# Patient Record
Sex: Male | Born: 1960 | Race: White | Hispanic: No | Marital: Married | Smoking: Former smoker
Health system: Southern US, Community
[De-identification: ages and names within clinical notes are randomized; demographics above are authoritative.]

## PROBLEM LIST (undated history)

## (undated) DIAGNOSIS — R29898 Other symptoms and signs involving the musculoskeletal system: Secondary | ICD-10-CM

## (undated) DIAGNOSIS — IMO0002 Reserved for concepts with insufficient information to code with codable children: Secondary | ICD-10-CM

## (undated) DIAGNOSIS — I219 Acute myocardial infarction, unspecified: Secondary | ICD-10-CM

## (undated) DIAGNOSIS — K219 Gastro-esophageal reflux disease without esophagitis: Secondary | ICD-10-CM

## (undated) DIAGNOSIS — I10 Essential (primary) hypertension: Secondary | ICD-10-CM

## (undated) DIAGNOSIS — M1990 Unspecified osteoarthritis, unspecified site: Secondary | ICD-10-CM

## (undated) DIAGNOSIS — G43909 Migraine, unspecified, not intractable, without status migrainosus: Secondary | ICD-10-CM

---

## 2000-07-07 ENCOUNTER — Emergency Department (HOSPITAL_COMMUNITY): Admission: EM | Admit: 2000-07-07 | Discharge: 2000-07-07 | Payer: Self-pay | Admitting: Emergency Medicine

## 2000-07-08 ENCOUNTER — Emergency Department (HOSPITAL_COMMUNITY): Admission: EM | Admit: 2000-07-08 | Discharge: 2000-07-08 | Payer: Self-pay | Admitting: Emergency Medicine

## 2000-07-09 ENCOUNTER — Emergency Department (HOSPITAL_COMMUNITY): Admission: EM | Admit: 2000-07-09 | Discharge: 2000-07-09 | Payer: Self-pay | Admitting: Emergency Medicine

## 2001-02-26 ENCOUNTER — Emergency Department (HOSPITAL_COMMUNITY): Admission: EM | Admit: 2001-02-26 | Discharge: 2001-02-26 | Payer: Self-pay | Admitting: Emergency Medicine

## 2001-02-26 ENCOUNTER — Encounter: Payer: Self-pay | Admitting: Emergency Medicine

## 2001-07-30 ENCOUNTER — Encounter: Payer: Self-pay | Admitting: Emergency Medicine

## 2001-07-30 ENCOUNTER — Emergency Department (HOSPITAL_COMMUNITY): Admission: EM | Admit: 2001-07-30 | Discharge: 2001-07-30 | Payer: Self-pay | Admitting: Emergency Medicine

## 2004-05-11 ENCOUNTER — Emergency Department (HOSPITAL_COMMUNITY): Admission: EM | Admit: 2004-05-11 | Discharge: 2004-05-11 | Payer: Self-pay | Admitting: Emergency Medicine

## 2004-06-08 ENCOUNTER — Emergency Department (HOSPITAL_COMMUNITY): Admission: EM | Admit: 2004-06-08 | Discharge: 2004-06-09 | Payer: Self-pay | Admitting: Emergency Medicine

## 2006-03-18 ENCOUNTER — Emergency Department (HOSPITAL_COMMUNITY): Admission: EM | Admit: 2006-03-18 | Discharge: 2006-03-18 | Payer: Self-pay | Admitting: Emergency Medicine

## 2011-01-28 ENCOUNTER — Emergency Department (HOSPITAL_COMMUNITY)
Admission: EM | Admit: 2011-01-28 | Discharge: 2011-01-28 | Disposition: A | Discharge status: Home / Self Care | Payer: Self-pay | Attending: Emergency Medicine | Admitting: Emergency Medicine

## 2011-01-28 DIAGNOSIS — S51809A Unspecified open wound of unspecified forearm, initial encounter: Secondary | ICD-10-CM | POA: Insufficient documentation

## 2011-01-28 DIAGNOSIS — W269XXA Contact with unspecified sharp object(s), initial encounter: Secondary | ICD-10-CM | POA: Insufficient documentation

## 2011-04-25 ENCOUNTER — Emergency Department (HOSPITAL_COMMUNITY): Payer: Self-pay

## 2011-04-25 ENCOUNTER — Emergency Department (HOSPITAL_COMMUNITY)
Admission: EM | Admit: 2011-04-25 | Discharge: 2011-04-25 | Disposition: A | Discharge status: Home / Self Care | Payer: Self-pay | Attending: Emergency Medicine | Admitting: Emergency Medicine

## 2011-04-25 DIAGNOSIS — R55 Syncope and collapse: Secondary | ICD-10-CM | POA: Insufficient documentation

## 2011-04-25 DIAGNOSIS — W1809XA Striking against other object with subsequent fall, initial encounter: Secondary | ICD-10-CM | POA: Insufficient documentation

## 2011-04-25 DIAGNOSIS — M542 Cervicalgia: Secondary | ICD-10-CM | POA: Insufficient documentation

## 2011-04-25 DIAGNOSIS — R42 Dizziness and giddiness: Secondary | ICD-10-CM | POA: Insufficient documentation

## 2011-04-25 DIAGNOSIS — S0180XA Unspecified open wound of other part of head, initial encounter: Secondary | ICD-10-CM | POA: Insufficient documentation

## 2011-04-25 LAB — DIFFERENTIAL
Basophils Relative: 0 % (ref 0–1)
Eosinophils Absolute: 0.2 10*3/uL (ref 0.0–0.7)
Neutrophils Relative %: 56 % (ref 43–77)

## 2011-04-25 LAB — BASIC METABOLIC PANEL
Chloride: 103 mEq/L (ref 96–112)
Creatinine, Ser: 0.93 mg/dL (ref 0.4–1.5)
GFR calc Af Amer: >60 mL/min (ref >60)
GFR calc non Af Amer: >60 mL/min (ref >60)
Potassium: 3.5 mEq/L (ref 3.5–5.1)

## 2011-04-25 LAB — CBC
MCH: 32 pg (ref 26.0–34.0)
Platelets: 369 10*3/uL (ref 150–400)
RBC: 4.28 MIL/uL (ref 4.22–5.81)
RDW: 13.1 % (ref 11.5–15.5)
WBC: 9.2 10*3/uL (ref 4.0–10.5)

## 2011-04-25 LAB — GLUCOSE, CAPILLARY: Glucose-Capillary: 113 mg/dL — ABNORMAL HIGH (ref 70–99)

## 2012-08-30 ENCOUNTER — Encounter (HOSPITAL_COMMUNITY): Payer: Self-pay | Admitting: Emergency Medicine

## 2012-08-30 ENCOUNTER — Emergency Department (HOSPITAL_COMMUNITY)
Admission: EM | Admit: 2012-08-30 | Discharge: 2012-08-30 | Disposition: A | Discharge status: Home / Self Care | Payer: Self-pay | Attending: Emergency Medicine | Admitting: Emergency Medicine

## 2012-08-30 DIAGNOSIS — M545 Low back pain, unspecified: Secondary | ICD-10-CM

## 2012-08-30 DIAGNOSIS — K219 Gastro-esophageal reflux disease without esophagitis: Secondary | ICD-10-CM | POA: Insufficient documentation

## 2012-08-30 DIAGNOSIS — Z885 Allergy status to narcotic agent status: Secondary | ICD-10-CM | POA: Insufficient documentation

## 2012-08-30 HISTORY — DX: Gastro-esophageal reflux disease without esophagitis: K21.9

## 2012-08-30 HISTORY — DX: Reserved for concepts with insufficient information to code with codable children: IMO0002

## 2012-08-30 MED ORDER — HYDROCODONE-ACETAMINOPHEN 5-325 MG PO TABS
1.0000 | ORAL_TABLET | Freq: Once | ORAL | Status: AC
  Administered 2012-08-30: 1 via ORAL
  Filled 2012-08-30: qty 1

## 2012-08-30 MED ORDER — DIAZEPAM 5 MG PO TABS
10.0000 mg | ORAL_TABLET | Freq: Once | ORAL | Status: AC
  Administered 2012-08-30: 10 mg via ORAL

## 2012-08-30 MED ORDER — DIAZEPAM 5 MG PO TABS: 5.0000 mg | ORAL_TABLET | Freq: Three times a day (TID) | ORAL | Status: DC | PRN

## 2012-08-30 MED ORDER — HYDROCODONE-ACETAMINOPHEN 5-325 MG PO TABS: ORAL_TABLET | ORAL | Status: DC

## 2012-08-30 NOTE — ED Provider Notes (Signed)
History     CSN: [id number]  Arrival date & time 08/30/12  7829   First MD Initiated Contact with Patient 08/30/12 0825      Chief Complaint  Patient presents with  . 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 He reports that he's had an occasional low back pain that he will taken ibuprofen for her which will improve his symptoms. He has never had any urinary symptoms, fever, numbness or weakness. He reports yesterday while he was under his own house he came across a snake which made him jump and twisted his back. He initially did not have much pain but did take a few ibuprofen after that episode. This morning at around 3 AM he got up to use the bathroom and when he tried to stand up from the bed he had a sharp pain that shot across his low back and now has pain in the lower left back and he indicates it is just lateral to midline. He didn't denies any shooting numbness or pain down his legs. He was able to urinate without any difficulty. He denies any skin rash. He denies any abdominal pain or nausea.  Patient is a 51 y.o. male presenting with back pain. The history is provided by the patient and the spouse.  Back Pain  Pertinent negatives include no fever, no numbness, no abdominal pain and no weakness.    Past Medical History  Diagnosis Date  . GERD (gastroesophageal reflux disease)     No past surgical history on file.  No family history on file.  History  Substance Use Topics  . Smoking status: Not on file  . Smokeless tobacco: Not on file  . Alcohol Use:       Review of Systems  Constitutional: Negative for fever, chills and unexpected weight change.  Gastrointestinal: Negative for nausea and abdominal pain.  Musculoskeletal: Positive for back pain.  Skin: Negative for rash and wound.  Neurological:  Negative for weakness and numbness.    Allergies  Morphine and related  Home Medications   Current Outpatient Rx  Name Route Sig Dispense Refill  . IBUPROFEN 200 MG PO TABS Oral Take 400 mg by mouth every 6 (six) hours as needed. For pain    . LANSOPRAZOLE 30 MG PO CPDR Oral Take 30 mg by mouth daily.    . CENTRUM SILVER ADULT 50+ PO Oral Take 1 tablet by mouth daily.    Marland Kitchen DIAZEPAM 5 MG PO TABS Oral Take 1 tablet (5 mg total) by mouth every 8 (eight) hours as needed (muscle spasms). 15 tablet 0  . HYDROCODONE-ACETAMINOPHEN 5-325 MG PO TABS  1-2 tablets po q 6 hours prn moderate to severe pain 20 tablet 0    BP 135/86  Pulse 66  Temp 97.8 F (36.6 C) (Oral)  Resp 16  SpO2 100%  Physical Exam  Nursing note and vitals reviewed. Constitutional: He appears well-developed and well-nourished. No distress.  Neck: Normal range of motion. Neck supple.  Abdominal: Soft. He exhibits no distension. There is no tenderness. There is no rebound.  Musculoskeletal: He exhibits tenderness. He exhibits no edema.       Lumbar back: He exhibits tenderness, pain and spasm. He exhibits no bony tenderness, no laceration and normal pulse.       Back:  Neurological: He is alert. He displays normal reflexes. Coordination normal.  Skin: Skin is warm and dry. No rash noted. He is not diaphoretic.  Psychiatric: He has a normal mood and affect.    ED Course  Procedures (including critical care time)  Labs Reviewed - No data to display No results found.   1. Low back pain       MDM  Patient with no numbness or weakness, reproducible low back spasms. Straight leg test is negative. Normal patellar reflexes bilaterally. History and exam is highly suggestive of musculoskeletal pain. I did inform him and his spouse to watch for extending weakness, urinary difficulty or incontinence. Plan is to continue treatment with NSAIDs at home and will also give a prescription for narcotic analgesia and  benzodiazepine. Otherwise he can continue evaluation and treatment as an outpatient as needed.        Gavin Pound. Oletta Lamas, MD 08/30/12 [phone]

## 2012-08-30 NOTE — ED Notes (Addendum)
Pt c/o back pain x 1 week wiith no history of back pain. No associated symptoms. Ambulatory, [facility]

## 2012-08-30 NOTE — Discharge Instructions (Signed)
 Back Pain, Adult Low back pain is very common. About 1 in 5 people have back pain.The cause of low back pain is rarely dangerous. The pain often gets better over time.About half of people with a sudden onset of back pain feel better in just 2 weeks. About 8 in 10 people feel better by 6 weeks.  CAUSES Some common causes of back pain include:  Strain of the muscles or ligaments supporting the spine.  Wear and tear (degeneration) of the spinal discs.  Arthritis.  Direct injury to the back. DIAGNOSIS Most of the time, the direct cause of low back pain is not known.However, back pain can be treated effectively even when the exact cause of the pain is unknown.Answering your caregiver's questions about your overall health and symptoms is one of the most accurate ways to make sure the cause of your pain is not dangerous. If your caregiver needs more information, he or she may order lab work or imaging tests (X-rays or MRIs).However, even if imaging tests show changes in your back, this usually does not require surgery. HOME CARE INSTRUCTIONS For many people, back pain returns.Since low back pain is rarely dangerous, it is often a condition that people can learn to [facility] their own.   Remain active. It is stressful on the back to sit or stand in one place. Do not sit, drive, or stand in one place for more than 30 minutes at a time. Take short walks on level surfaces as soon as pain allows.Try to increase the length of time you walk each day.  Do not stay in bed.Resting more than 1 or 2 days can delay your recovery.  Do not avoid exercise or work.Your body is made to move.It is not dangerous to be active, even though your back may hurt.Your back will likely heal faster if you return to being active before your pain is gone.  Pay attention to your body when you bend and lift. Many people have less discomfortwhen lifting if they bend their knees, keep the load close to their bodies,and  avoid twisting. Often, the most comfortable positions are those that put less stress on your recovering back.  Find a comfortable position to sleep. Use a firm mattress and lie on your side with your knees slightly bent. If you lie on your back, put a pillow under your knees.  Only take over-the-counter or prescription medicines as directed by your caregiver. Over-the-counter medicines to reduce pain and inflammation are often the most helpful.Your caregiver may prescribe muscle relaxant drugs.These medicines help dull your pain so you can more quickly return to your normal activities and healthy exercise.  Put ice on the injured area.  Put ice in a plastic bag.  Place a towel between your skin and the bag.  Leave the ice on for 15 to 20 minutes, 3 to 4 times a day for the first 2 to 3 days. After that, ice and heat may be alternated to reduce pain and spasms.  Ask your caregiver about trying back exercises and gentle massage. This may be of some benefit.  Avoid feeling anxious or stressed.Stress increases muscle tension and can worsen back pain.It is important to recognize when you are anxious or stressed and learn ways to manage it.Exercise is a great option. SEEK MEDICAL CARE IF:  You have pain that is not relieved with rest or medicine.  You have pain that does not improve in 1 week.  You have new symptoms.  You are generally  not feeling well. SEEK IMMEDIATE MEDICAL CARE IF:   You have pain that radiates from your back into your legs.  You develop new bowel or bladder control problems.  You have unusual weakness or numbness in your arms or legs.  You develop nausea or vomiting.  You develop abdominal pain.  You feel faint. Document Released: 11/14/2005 Document Revised: 05/15/2012 Document Reviewed: 04/04/2011 [facility] Patient Information 2013 [place], [place].    Narcotic and benzodiazepine use may cause drowsiness, slowed breathing or dependence.  Please use with  caution and do not drive, operate machinery or watch young children alone while taking them.  Taking combinations of these medications or drinking alcohol will potentiate these effects.

## 2014-11-22 ENCOUNTER — Encounter (HOSPITAL_COMMUNITY): Payer: Self-pay | Admitting: Emergency Medicine

## 2014-11-22 ENCOUNTER — Emergency Department (HOSPITAL_COMMUNITY)
Admission: EM | Admit: 2014-11-22 | Discharge: 2014-11-22 | Disposition: A | Discharge status: Home / Self Care | Payer: Self-pay | Attending: Emergency Medicine | Admitting: Emergency Medicine

## 2014-11-22 ENCOUNTER — Emergency Department (HOSPITAL_COMMUNITY): Payer: Self-pay

## 2014-11-22 DIAGNOSIS — Z79899 Other long term (current) drug therapy: Secondary | ICD-10-CM | POA: Insufficient documentation

## 2014-11-22 DIAGNOSIS — S20212A Contusion of left front wall of thorax, initial encounter: Secondary | ICD-10-CM | POA: Insufficient documentation

## 2014-11-22 DIAGNOSIS — Y998 Other external cause status: Secondary | ICD-10-CM | POA: Insufficient documentation

## 2014-11-22 DIAGNOSIS — K219 Gastro-esophageal reflux disease without esophagitis: Secondary | ICD-10-CM | POA: Insufficient documentation

## 2014-11-22 DIAGNOSIS — Z87891 Personal history of nicotine dependence: Secondary | ICD-10-CM | POA: Insufficient documentation

## 2014-11-22 DIAGNOSIS — T1490XA Injury, unspecified, initial encounter: Secondary | ICD-10-CM

## 2014-11-22 DIAGNOSIS — S4992XA Unspecified injury of left shoulder and upper arm, initial encounter: Secondary | ICD-10-CM | POA: Insufficient documentation

## 2014-11-22 DIAGNOSIS — Y9389 Activity, other specified: Secondary | ICD-10-CM | POA: Insufficient documentation

## 2014-11-22 DIAGNOSIS — Y9289 Other specified places as the place of occurrence of the external cause: Secondary | ICD-10-CM | POA: Insufficient documentation

## 2014-11-22 DIAGNOSIS — W208XXA Other cause of strike by thrown, projected or falling object, initial encounter: Secondary | ICD-10-CM | POA: Insufficient documentation

## 2014-11-22 MED ORDER — OXYCODONE-ACETAMINOPHEN 5-325 MG PO TABS
2.0000 | ORAL_TABLET | Freq: Once | ORAL | Status: AC
  Administered 2014-11-22: 2 via ORAL
  Filled 2014-11-22: qty 2

## 2014-11-22 MED ORDER — OXYCODONE-ACETAMINOPHEN 5-325 MG PO TABS: 1.0000 | ORAL_TABLET | ORAL | Status: DC | PRN

## 2014-11-22 MED ORDER — IBUPROFEN 800 MG PO TABS: 800.0000 mg | ORAL_TABLET | Freq: Three times a day (TID) | ORAL | Status: DC
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
## 2014-11-22 NOTE — Discharge Instructions (Signed)
Chest Contusion °A contusion is a deep bruise. Bruises happen when an injury causes bleeding under the skin. Signs of bruising include pain, puffiness (swelling), and discolored skin. The bruise may turn blue, purple, or yellow.  °HOME CARE °· Put ice on the injured area. °¨ Put ice in a plastic bag. °¨ Place a towel between the skin and the bag. °¨ Leave the ice on for 15-20 minutes at a time, 03-04 times a day for the first 48 hours. °· Only take medicine as told by your doctor. °· Rest. °· Take deep breaths (deep-breathing exercises) as told by your doctor. °· Stop smoking if you smoke. °· Do not lift objects over 5 pounds (2.3 kilograms) for 3 days or longer if told by your doctor. °GET HELP RIGHT AWAY IF:  °· You have more bruising or puffiness. °· You have pain that gets worse. °· You have trouble breathing. °· You are dizzy, weak, or pass out (faint). °· You have blood in your pee (urine) or poop (stool). °· You cough up or throw up (vomit) blood. °· Your puffiness or pain is not helped with medicines. °MAKE SURE YOU:  °· Understand these instructions. °· Will watch your condition. °· Will get help right away if you are not doing well or get worse. °Document Released: 05/02/2008 Document Revised: 08/08/2012 Document Reviewed: 05/07/2012 °Exit[employer]® Patient Information ©2015 [employer]. This information is not intended to replace advice given to you by your health care provider. Make sure you discuss any questions you have with your health care provider. ° °

## 2015-01-27 DIAGNOSIS — I219 Acute myocardial infarction, unspecified: Secondary | ICD-10-CM

## 2015-01-27 HISTORY — DX: Acute myocardial infarction, unspecified: I21.9

## 2015-02-03 ENCOUNTER — Emergency Department (HOSPITAL_COMMUNITY): Payer: Self-pay

## 2015-02-03 ENCOUNTER — Encounter (HOSPITAL_COMMUNITY): Payer: Self-pay | Admitting: Emergency Medicine

## 2015-02-03 ENCOUNTER — Encounter (HOSPITAL_COMMUNITY)
Admission: EM | Disposition: A | Discharge status: Home / Self Care | Payer: Self-pay | Source: Home / Self Care | Attending: Internal Medicine

## 2015-02-03 ENCOUNTER — Inpatient Hospital Stay (HOSPITAL_COMMUNITY)
Admission: EM | Admit: 2015-02-03 | Discharge: 2015-02-04 | DRG: 287 | Disposition: A | Discharge status: Home / Self Care | Payer: Self-pay | Attending: Internal Medicine | Admitting: Internal Medicine

## 2015-02-03 DIAGNOSIS — I25110 Atherosclerotic heart disease of native coronary artery with unstable angina pectoris: Secondary | ICD-10-CM

## 2015-02-03 DIAGNOSIS — Z79899 Other long term (current) drug therapy: Secondary | ICD-10-CM

## 2015-02-03 DIAGNOSIS — J449 Chronic obstructive pulmonary disease, unspecified: Secondary | ICD-10-CM | POA: Diagnosis present

## 2015-02-03 DIAGNOSIS — K219 Gastro-esophageal reflux disease without esophagitis: Secondary | ICD-10-CM | POA: Diagnosis present

## 2015-02-03 DIAGNOSIS — Z79891 Long term (current) use of opiate analgesic: Secondary | ICD-10-CM

## 2015-02-03 DIAGNOSIS — I200 Unstable angina: Secondary | ICD-10-CM

## 2015-02-03 DIAGNOSIS — I249 Acute ischemic heart disease, unspecified: Secondary | ICD-10-CM

## 2015-02-03 DIAGNOSIS — Z885 Allergy status to narcotic agent status: Secondary | ICD-10-CM

## 2015-02-03 DIAGNOSIS — Q245 Malformation of coronary vessels: Secondary | ICD-10-CM

## 2015-02-03 DIAGNOSIS — Z791 Long term (current) use of non-steroidal anti-inflammatories (NSAID): Secondary | ICD-10-CM

## 2015-02-03 DIAGNOSIS — Z8249 Family history of ischemic heart disease and other diseases of the circulatory system: Secondary | ICD-10-CM

## 2015-02-03 DIAGNOSIS — Z87891 Personal history of nicotine dependence: Secondary | ICD-10-CM

## 2015-02-03 HISTORY — PX: CARDIAC CATHETERIZATION: SHX172

## 2015-02-03 HISTORY — PX: LEFT HEART CATHETERIZATION WITH CORONARY ANGIOGRAM: SHX5451

## 2015-02-03 HISTORY — DX: Migraine, unspecified, not intractable, without status migrainosus: G43.909

## 2015-02-03 HISTORY — DX: Other symptoms and signs involving the musculoskeletal system: R29.898

## 2015-02-03 HISTORY — DX: Acute ischemic heart disease, unspecified: I24.9

## 2015-02-03 HISTORY — DX: Unspecified osteoarthritis, unspecified site: M19.90

## 2015-02-03 HISTORY — DX: Essential (primary) hypertension: I10

## 2015-02-03 HISTORY — DX: Acute myocardial infarction, unspecified: I21.9

## 2015-02-03 LAB — TROPONIN I
TROPONIN I: 0.19 ng/mL — AB (ref <0.031)
TROPONIN I: 0.09 ng/mL — AB (ref <0.031)

## 2015-02-03 LAB — CBC
HEMATOCRIT: 36.4 % — AB (ref 39.0–52.0)
HEMATOCRIT: 34.4 % — AB (ref 39.0–52.0)
HEMOGLOBIN: 12.7 g/dL — AB (ref 13.0–17.0)
Hemoglobin: 12.1 g/dL — ABNORMAL LOW (ref 13.0–17.0)
MCH: 31.8 pg (ref 26.0–34.0)
MCH: 31.5 pg (ref 26.0–34.0)
MCHC: 34.9 g/dL (ref 30.0–36.0)
MCHC: 35.2 g/dL (ref 30.0–36.0)
MCV: 91 fL (ref 78.0–100.0)
MCV: 89.6 fL (ref 78.0–100.0)
Platelets: 374 10*3/uL (ref 150–400)
Platelets: 370 10*3/uL (ref 150–400)
RBC: 4 MIL/uL — ABNORMAL LOW (ref 4.22–5.81)
RBC: 3.84 MIL/uL — AB (ref 4.22–5.81)
RDW: 13.6 % (ref 11.5–15.5)
RDW: 13.7 % (ref 11.5–15.5)
WBC: 6.2 10*3/uL (ref 4.0–10.5)
WBC: 8.3 10*3/uL (ref 4.0–10.5)

## 2015-02-03 LAB — BASIC METABOLIC PANEL
ANION GAP: 8 (ref 5–15)
BUN: 12 mg/dL (ref 6–23)
CALCIUM: 9.6 mg/dL (ref 8.4–10.5)
CHLORIDE: 109 mmol/L (ref 96–112)
CO2: 22 mmol/L (ref 19–32)
CREATININE: 0.88 mg/dL (ref 0.50–1.35)
GFR calc non Af Amer: >90 mL/min (ref >90)
Glucose, Bld: 104 mg/dL — ABNORMAL HIGH (ref 70–99)
Potassium: 4 mmol/L (ref 3.5–5.1)
Sodium: 139 mmol/L (ref 135–145)

## 2015-02-03 LAB — CBG MONITORING, ED: GLUCOSE-CAPILLARY: 104 mg/dL — AB (ref 70–99)

## 2015-02-03 LAB — I-STAT TROPONIN, ED: Troponin i, poc: 0.09 ng/mL (ref 0.00–0.08)

## 2015-02-03 LAB — CREATININE, SERUM
Creatinine, Ser: 0.84 mg/dL (ref 0.50–1.35)
GFR calc Af Amer: >90 mL/min (ref >90)
GFR calc non Af Amer: >90 mL/min (ref >90)

## 2015-02-03 LAB — MAGNESIUM: Magnesium: 2 mg/dL (ref 1.5–2.5)

## 2015-02-03 LAB — POCT ACTIVATED CLOTTING TIME
ACTIVATED CLOTTING TIME: 183 s
Activated Clotting Time: 282 seconds

## 2015-02-03 LAB — TSH: TSH: 2.468 u[IU]/mL (ref 0.350–4.500)

## 2015-02-03 LAB — PROTIME-INR
INR: 1.01 (ref 0.00–1.49)
Prothrombin Time: 13.4 seconds (ref 11.6–15.2)

## 2015-02-03 LAB — D-DIMER, QUANTITATIVE (NOT AT ARMC): D-Dimer, Quant: <0.27 ug/mL-FEU (ref 0.00–0.48)

## 2015-02-03 SURGERY — LEFT HEART CATHETERIZATION WITH CORONARY ANGIOGRAM
Anesthesia: LOCAL

## 2015-02-03 MED ORDER — SODIUM CHLORIDE 0.9 % IV SOLN: INTRAVENOUS | Status: DC

## 2015-02-03 MED ORDER — ONDANSETRON HCL 4 MG/2ML IJ SOLN: 4.0000 mg | Freq: Four times a day (QID) | INTRAMUSCULAR | Status: DC | PRN

## 2015-02-03 MED ORDER — SODIUM CHLORIDE 0.9 % IJ SOLN: 3.0000 mL | Freq: Two times a day (BID) | INTRAMUSCULAR | Status: DC

## 2015-02-03 MED ORDER — ENOXAPARIN SODIUM 40 MG/0.4ML ~~LOC~~ SOLN: 40.0000 mg | SUBCUTANEOUS | Status: DC

## 2015-02-03 MED ORDER — NITROGLYCERIN 0.4 MG SL SUBL: 0.4000 mg | SUBLINGUAL_TABLET | SUBLINGUAL | Status: DC | PRN

## 2015-02-03 MED ORDER — VERAPAMIL HCL 2.5 MG/ML IV SOLN
INTRAVENOUS | Status: AC
  Filled 2015-02-03: qty 2

## 2015-02-03 MED ORDER — ZOLPIDEM TARTRATE 5 MG PO TABS: 5.0000 mg | ORAL_TABLET | Freq: Every evening | ORAL | Status: DC | PRN

## 2015-02-03 MED ORDER — ADENOSINE 12 MG/4ML IV SOLN
12.0000 mL | Freq: Once | INTRAVENOUS | Status: DC
  Filled 2015-02-03: qty 12

## 2015-02-03 MED ORDER — ASPIRIN EC 81 MG PO TBEC
81.0000 mg | DELAYED_RELEASE_TABLET | Freq: Every day | ORAL | Status: DC
  Administered 2015-02-04: 81 mg via ORAL
  Filled 2015-02-03: qty 1

## 2015-02-03 MED ORDER — SODIUM CHLORIDE 0.9 % IJ SOLN: 3.0000 mL | INTRAMUSCULAR | Status: DC | PRN

## 2015-02-03 MED ORDER — ACETAMINOPHEN 325 MG PO TABS
650.0000 mg | ORAL_TABLET | ORAL | Status: DC | PRN
Start: 2015-02-03 — End: 2015-02-03

## 2015-02-03 MED ORDER — LIDOCAINE HCL (PF) 1 % IJ SOLN
INTRAMUSCULAR | Status: AC
  Filled 2015-02-03: qty 30

## 2015-02-03 MED ORDER — ALPRAZOLAM 0.25 MG PO TABS: 0.2500 mg | ORAL_TABLET | ORAL | Status: DC | PRN

## 2015-02-03 MED ORDER — CARVEDILOL 6.25 MG PO TABS
6.2500 mg | ORAL_TABLET | Freq: Two times a day (BID) | ORAL | Status: DC
  Administered 2015-02-04: 6.25 mg via ORAL
  Filled 2015-02-03: qty 1

## 2015-02-03 MED ORDER — MIDAZOLAM HCL 2 MG/2ML IJ SOLN
INTRAMUSCULAR | Status: AC
  Filled 2015-02-03: qty 2

## 2015-02-03 MED ORDER — HEPARIN SODIUM (PORCINE) 1000 UNIT/ML IJ SOLN
INTRAMUSCULAR | Status: AC
  Filled 2015-02-03: qty 1

## 2015-02-03 MED ORDER — AMLODIPINE BESYLATE 5 MG PO TABS
5.0000 mg | ORAL_TABLET | Freq: Every day | ORAL | Status: DC
  Administered 2015-02-03: 5 mg via ORAL
  Filled 2015-02-03: qty 1

## 2015-02-03 MED ORDER — HEPARIN (PORCINE) IN NACL 100-0.45 UNIT/ML-% IJ SOLN
950.0000 [IU]/h | INTRAMUSCULAR | Status: DC
  Administered 2015-02-03: 950 [IU]/h via INTRAVENOUS
  Filled 2015-02-03: qty 250

## 2015-02-03 MED ORDER — ACETAMINOPHEN 325 MG PO TABS: 650.0000 mg | ORAL_TABLET | ORAL | Status: DC | PRN

## 2015-02-03 MED ORDER — SODIUM CHLORIDE 0.9 % IV SOLN: 250.0000 mL | INTRAVENOUS | Status: DC | PRN

## 2015-02-03 MED ORDER — ASPIRIN EC 325 MG PO TBEC
325.0000 mg | DELAYED_RELEASE_TABLET | Freq: Once | ORAL | Status: AC
  Administered 2015-02-03: 325 mg via ORAL
  Filled 2015-02-03: qty 1

## 2015-02-03 MED ORDER — ATORVASTATIN CALCIUM 40 MG PO TABS: 40.0000 mg | ORAL_TABLET | Freq: Every day | ORAL | Status: DC

## 2015-02-03 MED ORDER — FENTANYL CITRATE 0.05 MG/ML IJ SOLN
INTRAMUSCULAR | Status: AC
  Filled 2015-02-03: qty 2

## 2015-02-03 MED ORDER — HEPARIN BOLUS VIA INFUSION
3000.0000 [IU] | Freq: Once | INTRAVENOUS | Status: AC
  Administered 2015-02-03: 3000 [IU] via INTRAVENOUS
  Filled 2015-02-03: qty 3000

## 2015-02-03 MED ORDER — NITROGLYCERIN 1 MG/10 ML FOR IR/CATH LAB
INTRA_ARTERIAL | Status: AC
  Filled 2015-02-03: qty 10

## 2015-02-03 MED ORDER — HEPARIN (PORCINE) IN NACL 2-0.9 UNIT/ML-% IJ SOLN
INTRAMUSCULAR | Status: AC
  Filled 2015-02-03: qty 1500

## 2015-02-03 MED ORDER — SODIUM CHLORIDE 0.9 % IV SOLN
INTRAVENOUS | Status: AC
  Administered 2015-02-03: 18:00:00 via INTRAVENOUS

## 2015-02-03 NOTE — ED Provider Notes (Signed)
54 year old male, prior smoker, presents after having several episodes of exertional chest pain with left arm heaviness, shortness of breath and diaphoresis, the last occurred this morning and was severe, gradually improved and is now resolved. On exam the patient has clear heart and lung sounds, no peripheral edema, no murmurs, soft abdomen, no JVD. His EKG is unremarkable, his labs show a borderline elevation in his troponin to 0.09, he has no pain at this time but will start heparin, aspirin, discussed with cardiology as the patient will need to be admitted as this could represent an acute coronary syndrome. The patient has been informed of the plan and is in agreement.  Discussed care with cardiologist, they will admit the patient to the hospital. I worry that his symptoms represent significant unstable angina, his troponin came back borderline positive. The patient is critically ill with a cardiac etiology of his symptoms most likely. We'll admit to cardiology.  Heparin given with gtt - pharmacy to help dose.  CRITICAL CARE Performed by: Vida Roller Total critical care time: 35 Critical care time was exclusive of separately billable procedures and treating other patients. Critical care was necessary to treat or prevent imminent or life-threatening deterioration. Critical care was time spent personally by me on the following activities: development of treatment plan with patient and/or surrogate as well as nursing, discussions with consultants, evaluation of patient's response to treatment, examination of patient, obtaining history from patient or surrogate, ordering and performing treatments and interventions, ordering and review of laboratory studies, ordering and review of radiographic studies, pulse oximetry and re-evaluation of patient's condition.    EKG Interpretation  Date/Time:  Tuesday February 03 2015 10:50:04 EST Ventricular Rate:  76 PR Interval:  146 QRS Duration: 102 QT  Interval:  368 QTC Calculation: 414 R Axis:   81 Text Interpretation:  Normal sinus rhythm with sinus arrhythmia Normal ECG since last tracing no significant change Confirmed by Danzig Macgregor  MD, Mostafa Yuan ([id number]) on 02/03/2015 11:22:54 AM       Medical screening examination/treatment/procedure(s) were conducted as a shared visit with non-physician practitioner(s) and myself.  I personally evaluated the patient during the encounter.  Clinical Impression:   Final diagnoses:  Acute coronary syndrome         Eber Hong, MD 02/03/15 [phone]

## 2015-02-03 NOTE — ED Notes (Signed)
Still waiting on heparin drip

## 2015-02-03 NOTE — CV Procedure (Signed)
Curtis Simpson is a 54 y.o. male    [id number]  [id number] LOCATION:  FACILITY: [facility]  PHYSICIAN: Lennette Bihari, MD, [facility] 03-04-1961   DATE OF PROCEDURE:  02/03/2015    CARDIAC CATHETERIZATION/FFR     HISTORY:    Curtis Simpson is a 54 y.o. male   PROCEDURE:  Fractional flow reserve of the left anterior descending artery  The patient had undergone diagnostic catheterization via the right radial approach by Dr. Teressa Lower.  Please refer to his catheterization report.  I was asked to perform FFR or IVUS of his LAD lesion to determine if percutaneous coronary intervention was necessary.   The radial sheath was in place.  Initially, a safety J-wire was inserted but due to resistance above the elbow due to probable spasm this was removed.  1.5 mg of verapamil was administered via the radial sheath.  A Versicore wire was then inserted with a 6 [place]  XB LAD 3.5 guide.  Patient received additional 2000 units of heparin at the start of this procedure.  The patient had already received 3000 with the diagnostic cath.  ACT was 186.  The patient received an additional 2000 units of heparin.  ACT was documented to be therapeutic at 281.  Repeat injection into the left coronary system in the view in which the lesion had appeared.  The tightest (LAO 32, cranial 33) .  Now demonstrated marked improvement from the previous apparent 80% eccentric focal narrowing.  A volcano wire was then inserted proximal to the lesion for normalization which was 0.99.  The transducer was advanced distal to the lesion.  Adenosine infusion was administered per protocol.  Following 2 minutes of adenosineinfusion the FFR was .96, which was not felt to be a significant lesion.  At that point, the decision was made not to pursue intervention.  Patient received an additional 200 g of intracoronary nitroglycerin with near normalization of the vessel at the site of previous narrowing, suggesting a significant component of coronary  vasospasm.   IMPRESSION:  Fractional flow reserve of the left anterior descending artery.  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    Lennette Bihari, MD, [facility] 02/03/2015 6:41 PM

## 2015-02-03 NOTE — Progress Notes (Signed)
ANTICOAGULATION CONSULT NOTE - Initial Consult  Pharmacy Consult for heparin Indication: chest pain/ACS  Allergies  Allergen Reactions  . Morphine And Related Itching    Burning    Patient Measurements: Height: 5\' 9"  (175.3 cm) Weight: 141 lb (63.957 kg) IBW/kg (Calculated) : 70.7 Heparin Dosing Weight: 64 kg  Vital Signs: Temp: 98 F (36.7 C) (03/08 1105) Temp Source: Oral (03/08 1105) BP: 142/103 mmHg (03/08 1245) Pulse Rate: 73 (03/08 1245)  Labs:  Recent Labs  02/03/15 1154  HGB 12.7*  HCT 36.4*  PLT 374    CrCl cannot be calculated (Patient has no serum creatinine result on file.).   Medical History: Past Medical History  Diagnosis Date  . GERD (gastroesophageal reflux disease)   . Ulcer     Medications:  See medication history  Assessment: 54 yo man to start heparin for ACS.   Goal of Therapy:  Heparin level 0.3-0.7 units/ml Monitor platelets by anticoagulation protocol: Yes   Plan:  Heparin bolus 3000 units and drip at 950 units/hr Check heparin level 6 hours after start and daily while on heparin Monitor for bleeding complications  Kitti Mcclish Poteet 02/03/2015,1:01 PM

## 2015-02-03 NOTE — ED Provider Notes (Signed)
CSN: [id number]     Arrival date & time 02/03/15  1047 History   First MD Initiated Contact with Patient 02/03/15 1159     Chief Complaint  Patient presents with  . Chest Pain     (Consider location/radiation/quality/duration/timing/severity/associated sxs/prior Treatment) Patient is a 54 y.o. male presenting with chest pain. The history is provided by the patient, a relative and the spouse. No language interpreter was used.  Chest Pain Associated symptoms: nausea, shortness of breath and weakness   Associated symptoms: no abdominal pain, no dizziness, no fever, no headache and not vomiting   Curtis Simpson is a 54 y.o. White male who presents with sudden onset chest pain while at work today at 10am that felt like a heaviness in the center of the chest.  It lasted a few seconds and resolved after he belched. The pain came back shortly after and lasted for a few minutes. He says the pain was 6/10. He said the pain did not radiate anywhere but he did have bilateral arm heaviness, was short of breath, and diaphoretic.  He called his wife shortly after the incident and she could not understand him and told him to take his glucose pills.  She said he was nauseated when she came home but fully coherent. He has had 3 similar episodes in the last week. He denies any vision changes, abdominal pain, no extremity weakness, no urinary problems.  His mother has a history of CAD. He is a former smoker.  Past Medical History  Diagnosis Date  . GERD (gastroesophageal reflux disease)   . Ulcer    History reviewed. No pertinent past surgical history. No family history on file. History  Substance Use Topics  . Smoking status: Former Smoker -- 2.00 packs/day    Types: Cigarettes  . Smokeless tobacco: Not on file  . Alcohol Use: Yes    Review of Systems  Constitutional: Negative for fever and chills.  Eyes: Negative for visual disturbance.  Respiratory: Positive for shortness of breath.   Cardiovascular:  Positive for chest pain.  Gastrointestinal: Positive for nausea. Negative for vomiting, abdominal pain and diarrhea.  Neurological: Positive for weakness. Negative for dizziness, seizures, syncope, light-headedness and headaches.  All other systems reviewed and are negative.     Allergies  Morphine and related  Home Medications   Prior to Admission medications   Medication Sig Start Date End Date Taking? Authorizing Provider  diazepam (VALIUM) 5 MG tablet Take 1 tablet (5 mg total) by mouth every 8 (eight) hours as needed (muscle spasms). Patient not taking: Reported on 11/22/2014 08/30/12   Quita Skye, MD  HYDROcodone-acetaminophen (NORCO/VICODIN) 5-325 MG per tablet 1-2 tablets po q 6 hours prn moderate to severe pain Patient not taking: Reported on 11/22/2014 08/30/12   Quita Skye, MD  ibuprofen (ADVIL,MOTRIN) 800 MG tablet Take 1 tablet (800 mg total) by mouth 3 (three) times daily. 11/22/14   Tammi Triplett, PA-C  naproxen sodium (ALEVE) 220 MG tablet Take 220 mg by mouth 2 (two) times daily as needed (pain).    Historical Provider, MD  oxyCODONE-acetaminophen (PERCOCET/ROXICET) 5-325 MG per tablet Take 1 tablet by mouth every 4 (four) hours as needed. 11/22/14   Tammi Triplett, PA-C   BP 140/85 mmHg  Pulse 77  Temp(Src) 98 F (36.7 C) (Oral)  Resp 15  Ht 5\' 9"  (1.753 m)  Wt 141 lb (63.957 kg)  BMI 20.81 kg/m2  SpO2 99% Physical Exam  Constitutional: He is oriented to person, place, and  time. He appears well-developed and well-nourished.  HENT:  Head: Normocephalic and atraumatic.  Eyes: Conjunctivae are normal.  Neck: Normal range of motion. Neck supple.  Cardiovascular: Normal rate, regular rhythm and normal heart sounds.   No pain now.   Pulmonary/Chest: Effort normal and breath sounds normal.  Lungs are clear to auscultation bilaterally. No difficulty breathing.   Abdominal: Soft. There is no tenderness.  Musculoskeletal: Normal range of motion.  No lower  extremity edema.  Neurological: He is alert and oriented to person, place, and time.  Skin: Skin is warm and dry.  Nursing note and vitals reviewed.   ED Course  Procedures (including critical care time) Labs Review Labs Reviewed  CBG MONITORING, ED - Abnormal; Notable for the following:    Glucose-Capillary 104 (*)    All other components within normal limits  BASIC METABOLIC PANEL  CBC  I-STAT TROPOININ, ED  I-STAT TROPOININ, ED    Imaging Review No results found.   EKG Interpretation   Date/Time:  Tuesday February 03 2015 10:50:04 EST Ventricular Rate:  76 PR Interval:  146 QRS Duration: 102 QT Interval:  368 QTC Calculation: 414 R Axis:   81 Text Interpretation:  Normal sinus rhythm with sinus arrhythmia Normal ECG  since last tracing no significant change Confirmed by MILLER  MD, BRIAN  ([id number]) on 02/03/2015 11:22:54 AM      MDM   Final diagnoses:  None  The patient has never had a cardiac workup, no prior stress test, or primary care visit.  He does not take any medication except an occassional over the counter glucose pill but has never been diagnosed with hypoglycemia.  His chest xray shows hyperinflated lungs but no active disease. His BMP shows no electrolyte abnormality and glucose is normal. His CBC is within normal limits also. The patient does not have pain now. Vitals are stable. EKG shows normal sinus rhythm at a rate of 76. His troponin is slightly elevated at .09 and he has no previous medical care so he will need to be admitted for further workup.  I ordered an aspirin and heparin drip.   Patient and family agree with admission for further workup.   Dr. Fleet Contras spoke to cardiology, Dr. Gala Romney, and the patient will be admitted to tele.    Catha Gosselin, PA-C 02/03/15 1355  Eber Hong, MD 02/03/15 [phone]

## 2015-02-03 NOTE — ED Notes (Signed)
Patient states he started having chest pain approximately an hour ago with nausea and confusion.   Patient states he took a "sugar pill".  Patient denies diabetes.  Patient denies other symptoms.

## 2015-02-03 NOTE — H&P (Signed)
History and Physical   Patient ID: Curtis Simpson MRN: [id number], DOB/AGE: [birth year] 53 y.o. Date of Encounter: 02/03/2015  Primary Physician: No primary care provider on file. Primary Cardiologist: New  Chief Complaint:  Chest pain  HPI: Curtis Simpson is a 54 y.o. male with no previous cardiac history prior to this visit. Only known PMH is GERD and an ulcer (unknow location), he states they gave him Prevacid but never did any test; he stopped taking Prevacid last summer.   He presents today with sudden onset mid-sternal chest pain that started at 10 a.m. 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 He felt a lot of pressure right in the center of his chest. He belched and it relieved the pain somewhat but then it came right back, rated it a 6/10. There was radiation of the pain to both arms with a heaviness.   He walked to his car and called his wife while he was trying to get a "sugar pill" because he thought his blood sugar was low; he was told in 1989 that he has hypoglycemia but has never followed up. While talking to his wife he became confused, diaphoretic, and nauseous and his wife noticed on the phone he didn't sound like himself. He reports a similar episode to this that occurred last week; the same mid-sternal chest pain but without confusion, diaphoresis, or nausea. It lasted just a few minutes and then was gone. He denies orthopnea, PND, dizziness, vision changes, or headache. He   Both his grandfather and father died of heart attacks, ages unknown, his mother is living but he does not know if she has any cardiac history. He is a former smoker of 2-3 packs per day for 40 years; started at age 60 and quit at age 67. He is currently uninsured and does not have a PCP. His wife and he both states he does not like going to the doctor or taking medications.   He came to the ER, where he was given ASA 325 mg and started on heparin because his initial  troponin was elevated. He is currently pain-free but still diaphoretic.  Past Medical History  Diagnosis Date  . GERD (gastroesophageal reflux disease)   . Ulcer     Surgical History: History reviewed. No pertinent past surgical history.   I have reviewed the patient's current medications. Other meds on list were old, pt not taking. Medication Sig  naproxen sodium (ALEVE) 220 MG tablet Take 220 mg by mouth 2 (two) times daily as needed (pain).   Scheduled Meds:  Continuous Infusions: . heparin 950 Units/hr (02/03/15 1424)   Allergies:  Allergies  Allergen Reactions  . Morphine And Related Itching    Burning   History   Social History  . Marital Status: Married    Spouse Name: N/A  . Number of Children: N/A  . Years of Education: N/A   Occupational History  . Plumber    Social History Main Topics  . Smoking status: Former Smoker -- 2.00 packs/day for 41 years    Types: Cigarettes    Quit date: 11/28/2013  . Smokeless tobacco: Not on file  . Alcohol Use: 0.0 oz/week    0 Standard drinks or equivalent per week  . Drug Use: No  . Sexual Activity: Not on file   Other Topics Concern  . Not on file   Social History Narrative   Lives with wife in  [place].    Family History  Problem Relation Age of Onset  . Heart attack Father   . Cancer Father    Family Status  Relation Status Death Age  . Father Deceased     MI  . Mother Alive   . Paternal Grandfather Deceased     MI    Review of Systems: Long history of night sweats, no recent change. Decreased exercise tolerance with fatigue and a component of DOE. No fevers or chills. Chronic non-productive cough.  Full 14-point review of systems otherwise negative except as noted above.  Physical Exam: Blood pressure 157/101, pulse 81, temperature 98 F (36.7 C), temperature source Oral, resp. rate 22, height  (1.753 m), weight 141 lb (63.957 kg), SpO2 97 %. General: Well developed, well nourished,male in no  acute distress. Head: Normocephalic, atraumatic, sclera non-icteric, no xanthomas, nares are without discharge. Dentition: good Neck: No carotid bruits. JVD not elevated. No thyromegally Lungs: Good expansion bilaterally. without wheezes or rhonchi. Non-productive cough. Heart: Regular rate and rhythm with S1 S2.  No S3 or S4.  No murmur, no rubs, or gallops appreciated. Abdomen: Soft, non-tender, non-distended with normoactive bowel sounds. No hepatomegaly. No rebound/guarding. No obvious abdominal masses. Msk:  Strength and tone appear normal for age. No joint deformities or effusions, no spine or costo-vertebral angle tenderness. Extremities: No clubbing or cyanosis. No edema.  Distal pedal pulses are 2+ in 4 extrem Neuro: Alert and oriented X 3. Moves all extremities spontaneously. No focal deficits noted. Psych:  Responds to questions appropriately with a normal affect. Skin: Clammy to touch. No rashes or lesions noted  Labs:   Lab Results  Component Value Date   WBC 6.2 02/03/2015   HGB 12.7* 02/03/2015   HCT 36.4* 02/03/2015   MCV 91.0 02/03/2015   PLT 374 02/03/2015    Recent Labs Lab 02/03/15 1154  NA 139  K 4.0  CL 109  CO2 22  BUN 12  CREATININE 0.88  CALCIUM 9.6  GLUCOSE 104*   No results for input(s): CKTOTAL, CKMB, TROPONINI in the last 72 hours.  Recent Labs  02/03/15 1200  TROPIPOC 0.09*   Radiology/Studies: Dg Chest 2 View 02/03/2015   CLINICAL DATA:  Mid chest pain.  EXAM: CHEST  2 VIEW  COMPARISON:  11/22/2014  FINDINGS: There is hyperinflation of the lungs compatible with COPD. Heart and mediastinal contours are within normal limits. No focal opacities or effusions. No acute bony abnormality.  IMPRESSION: COPD.  No active disease.   Electronically Signed   By: Charlett Nose M.D.   On: 02/03/2015 13:31   ECG: SR, rate 76, no Q waves, no acute ischemic changes.  ASSESSMENT AND PLAN:  Active Problems:  1) Acute coronary syndrome - admit - cycle  enzymes - continue ASA, Heparin, add BB and statin - Cath in am, The risks and benefits of a cardiac catheterization including, but not limited to, death, stroke, MI, kidney damage and bleeding were discussed with the patient who indicates understanding and agrees to proceed.  2) HTN 3) COPD 4) GERD 5) HL   Signed, Theodore Demark, PA-C 02/03/2015 2:44 PM Beeper [phone]   Patient seen and examined with Theodore Demark, PA-C. We discussed all aspects of the encounter. I agree with the assessment and plan as stated above.   Presentation very concerning for [place]. Will need cardiac cath. I have reviewed the risks, indications, and alternatives to angioplasty and stenting with the patient. Risks include but are not limited  to bleeding, infection, vascular injury, stroke, myocardial infection, arrhythmia, kidney injury, radiation-related injury in the case of prolonged fluoroscopy use, emergency cardiac surgery, and death. The patient understands the risks of serious complication is low (<1%) and he agrees to proceed. Will proceed with cath later today or in am. Treat with asa, statin, heparin, b-blocker.    Dajon Lazar,MD 3:09 PM

## 2015-02-03 NOTE — Interval H&P Note (Signed)
History and Physical Interval Note:  02/03/2015 4:02 PM  Curtis Simpson  has presented today for surgery, with the diagnosis of cp  The various methods of treatment have been discussed with the patient and family. After consideration of risks, benefits and other options for treatment, the patient has consented to  Procedure(s): LEFT HEART CATHETERIZATION WITH CORONARY ANGIOGRAM (N/A) and possible angioplasty as a surgical intervention .  The patient's history has been reviewed, patient examined, no change in status, stable for surgery.  I have reviewed the patient's chart and labs.  Questions were answered to the patient's satisfaction.     Daniel Bensimhon

## 2015-02-03 NOTE — CV Procedure (Signed)
Cardiac Cath Procedure Note:  Indication: Unstable angina  Procedures performed:  1) Selective coronary angiography 2) Left heart catheterization 3) Left ventriculogram  Description of procedure:   The risks and indication of the procedure were explained. Consent was signed and placed on the chart. An appropriate timeout was taken prior to the procedure. After a normal Allen's test was confirmed, the right wrist was prepped and draped in the routine sterile fashion and anesthetized with 1% local lidocaine.   A 5 FR arterial sheath was then placed in the right radial artery using a modified Seldinger technique. Systemic heparin was administered. 3mg  IV verapamil was given through the sheath. Standard catheters including a JL 3.5, JR4 and straight pigtail were used. All catheter exchanges were made over a wire.  Complications:  None apparent  Findings:  Ao Pressure: LV Pressure: There was no signficant gradient across the aortic valve on pullback.  Left main: Normal  LAD: Long vessel courses to apex. Gives off 2 large diagonals. Ostial napkin ring like lesion that appears to be at least 70%. Otherwise normal. Myocardial bridging segment in midsection without flow limitation.   LCX: Made up primarily of large OM-1. Normal  RCA: Dominant vessel. Normal.   LV-gram done in the RAO projection: Ejection fraction = 55% no regional wall motion abnormalities  Assessment: 1. He has normal coronary arteries except for apparent napkin-ring lesion in ostial LAD.  2. Normal EF  Plan/Discussion:  Plan IVUS of LAD by Dr. Tresa Endo.   Arvilla Meres MD 4:33 PM

## 2015-02-04 ENCOUNTER — Encounter (HOSPITAL_COMMUNITY): Payer: Self-pay | Admitting: Internal Medicine

## 2015-02-04 DIAGNOSIS — I249 Acute ischemic heart disease, unspecified: Principal | ICD-10-CM

## 2015-02-04 DIAGNOSIS — Z8249 Family history of ischemic heart disease and other diseases of the circulatory system: Secondary | ICD-10-CM

## 2015-02-04 DIAGNOSIS — Z87891 Personal history of nicotine dependence: Secondary | ICD-10-CM

## 2015-02-04 DIAGNOSIS — J449 Chronic obstructive pulmonary disease, unspecified: Secondary | ICD-10-CM | POA: Diagnosis present

## 2015-02-04 HISTORY — DX: Family history of ischemic heart disease and other diseases of the circulatory system: Z82.49

## 2015-02-04 LAB — COMPREHENSIVE METABOLIC PANEL
ALBUMIN: 3.4 g/dL — AB (ref 3.5–5.2)
ALT: 14 U/L (ref 0–53)
AST: 16 U/L (ref 0–37)
Alkaline Phosphatase: 61 U/L (ref 39–117)
Anion gap: 4 — ABNORMAL LOW (ref 5–15)
BILIRUBIN TOTAL: 0.9 mg/dL (ref 0.3–1.2)
BUN: 10 mg/dL (ref 6–23)
CHLORIDE: 108 mmol/L (ref 96–112)
CO2: 27 mmol/L (ref 19–32)
Calcium: 9 mg/dL (ref 8.4–10.5)
Creatinine, Ser: 0.82 mg/dL (ref 0.50–1.35)
GFR calc Af Amer: >90 mL/min (ref >90)
GFR calc non Af Amer: >90 mL/min (ref >90)
GLUCOSE: 98 mg/dL (ref 70–99)
POTASSIUM: 3.8 mmol/L (ref 3.5–5.1)
Sodium: 139 mmol/L (ref 135–145)
TOTAL PROTEIN: 5.9 g/dL — AB (ref 6.0–8.3)

## 2015-02-04 LAB — LIPID PANEL
CHOLESTEROL: 160 mg/dL (ref 0–200)
HDL: 37 mg/dL — ABNORMAL LOW (ref >39)
LDL Cholesterol: 100 mg/dL — ABNORMAL HIGH (ref 0–99)
Total CHOL/HDL Ratio: 4.3 RATIO
Triglycerides: 113 mg/dL (ref <150)
VLDL: 23 mg/dL (ref 0–40)

## 2015-02-04 LAB — CBC
HCT: 34.9 % — ABNORMAL LOW (ref 39.0–52.0)
HEMOGLOBIN: 12.2 g/dL — AB (ref 13.0–17.0)
MCH: 31.6 pg (ref 26.0–34.0)
MCHC: 35 g/dL (ref 30.0–36.0)
MCV: 90.4 fL (ref 78.0–100.0)
Platelets: 369 10*3/uL (ref 150–400)
RBC: 3.86 MIL/uL — AB (ref 4.22–5.81)
RDW: 13.9 % (ref 11.5–15.5)
WBC: 6.4 10*3/uL (ref 4.0–10.5)

## 2015-02-04 LAB — TROPONIN I: Troponin I: 0.08 ng/mL — ABNORMAL HIGH (ref <0.031)

## 2015-02-04 MED ORDER — ISOSORBIDE MONONITRATE ER 30 MG PO TB24: 15.0000 mg | ORAL_TABLET | Freq: Every day | ORAL | Status: DC

## 2015-02-04 MED ORDER — ASPIRIN 81 MG PO TBEC: 81.0000 mg | DELAYED_RELEASE_TABLET | Freq: Every day | ORAL | Status: AC

## 2015-02-04 MED ORDER — ACETAMINOPHEN 325 MG PO TABS: 650.0000 mg | ORAL_TABLET | ORAL | Status: DC | PRN

## 2015-02-04 MED ORDER — ATORVASTATIN CALCIUM 40 MG PO TABS: 40.0000 mg | ORAL_TABLET | Freq: Every day | ORAL | Status: DC

## 2015-02-04 MED ORDER — ATENOLOL 50 MG PO TABS: 50.0000 mg | ORAL_TABLET | Freq: Every day | ORAL | Status: DC

## 2015-02-04 MED ORDER — NITROGLYCERIN 0.4 MG SL SUBL: 0.4000 mg | SUBLINGUAL_TABLET | SUBLINGUAL | Status: DC | PRN

## 2015-02-04 NOTE — Progress Notes (Signed)
UR Completed Geovana Gebel Graves-Bigelow, RN,BSN [phone]  

## 2015-02-04 NOTE — Discharge Summary (Signed)
Patient ID: Curtis Simpson,  MRN: [id number], DOB/AGE: 03-08-1961 54 y.o.  Admit date: 02/03/2015 Discharge date: 02/04/2015  Primary Care Provider: No PCP Per Patient Primary Cardiologist: Dr Tresa Endo (new)  Discharge Diagnoses Principal Problem:   Acute coronary syndrome Active Problems:   Family history of coronary artery disease in father   History of smoking   COPD on CXR    Procedures: diagnostic cath and LAD [facility] 02/03/15   Hospital Course:  54 y.o. Male, Ex-smoker, quit Jan 2015, with no previous cardiac history prior to this visit, only known PMH is GERD and a FM Hx of CAD. Pt presented with Chest pain consistent with [place] while doing strenuous work (digging out a septic tank). His EKG was normal but his troponin was elevated.19. Cath done 02/03/15 showed a napkin ring LAD narrowing and an EF of 55%. No other significant CAD noted.  Dr Tresa Endo performed FFR and it was felt the lesion was not significant. It is presumed the pt had coronary spasm. Plan is for medical Rx and O)P f/u with Dr Tresa Endo.   Discharge Vitals:  Blood pressure 118/79, pulse 72, temperature 97.7 F (36.5 C), temperature source Oral, resp. rate 16, height  (1.753 m), weight 145 lb 8 oz (65.998 kg), SpO2 97 %.    Labs: Results for orders placed or performed during the hospital encounter of 02/03/15 (from the past 24 hour(s))  CBG monitoring, ED     Status: Abnormal   Collection Time: 02/03/15 11:33 AM  Result Value Ref Range   Glucose-Capillary 104 (H) 70 - 99 mg/dL   Comment 1 Notify RN    Comment 2 Documented in Char   Basic metabolic panel     Status: Abnormal   Collection Time: 02/03/15 11:54 AM  Result Value Ref Range   Sodium 139 135 - 145 mmol/L   Potassium 4.0 3.5 - 5.1 mmol/L   Chloride 109 96 - 112 mmol/L   CO2 22 19 - 32 mmol/L   Glucose, Bld 104 (H) 70 - 99 mg/dL   BUN 12 6 - 23 mg/dL   Creatinine, Ser 9.56 0.50 - 1.35 mg/dL   Calcium 9.6 8.4 - 21.3 mg/dL   GFR calc non Af Amer >90  >90 mL/min   GFR calc Af Amer >90 >90 mL/min   Anion gap 8 5 - 15  CBC     Status: Abnormal   Collection Time: 02/03/15 11:54 AM  Result Value Ref Range   WBC 6.2 4.0 - 10.5 K/uL   RBC 4.00 (L) 4.22 - 5.81 MIL/uL   Hemoglobin 12.7 (L) 13.0 - 17.0 g/dL   HCT 08.6 (L) 57.8 - 46.9 %   MCV 91.0 78.0 - 100.0 fL   MCH 31.8 26.0 - 34.0 pg   MCHC 34.9 30.0 - 36.0 g/dL   RDW 62.9 52.8 - 41.3 %   Platelets 374 150 - 400 K/uL  I-stat troponin, ED (not at [facility])     Status: Abnormal   Collection Time: 02/03/15 12:00 PM  Result Value Ref Range   Troponin i, poc 0.09 (HH) 0.00 - 0.08 ng/mL   Comment NOTIFIED PHYSICIAN    Comment 3          Troponin I     Status: Abnormal   Collection Time: 02/03/15  3:26 PM  Result Value Ref Range   Troponin I 0.19 (H) <0.031 ng/mL  POCT Activated clotting time     Status: None   Collection Time:  02/03/15  4:48 PM  Result Value Ref Range   Activated Clotting Time 183 seconds  POCT Activated clotting time     Status: None   Collection Time: 02/03/15  5:01 PM  Result Value Ref Range   Activated Clotting Time 282 seconds  Troponin I     Status: Abnormal   Collection Time: 02/03/15  9:13 PM  Result Value Ref Range   Troponin I 0.09 (H) <0.031 ng/mL  Magnesium     Status: None   Collection Time: 02/03/15  9:13 PM  Result Value Ref Range   Magnesium 2.0 1.5 - 2.5 mg/dL  TSH     Status: None   Collection Time: 02/03/15  9:13 PM  Result Value Ref Range   TSH 2.468 0.350 - 4.500 uIU/mL  Protime-INR     Status: None   Collection Time: 02/03/15  9:13 PM  Result Value Ref Range   Prothrombin Time 13.4 11.6 - 15.2 seconds   INR 1.01 0.00 - 1.49  CBC     Status: Abnormal   Collection Time: 02/03/15  9:13 PM  Result Value Ref Range   WBC 8.3 4.0 - 10.5 K/uL   RBC 3.84 (L) 4.22 - 5.81 MIL/uL   Hemoglobin 12.1 (L) 13.0 - 17.0 g/dL   HCT 16.1 (L) 09.6 - 04.5 %   MCV 89.6 78.0 - 100.0 fL   MCH 31.5 26.0 - 34.0 pg   MCHC 35.2 30.0 - 36.0 g/dL   RDW 40.9 81.1  - 91.4 %   Platelets 370 150 - 400 K/uL  Creatinine, serum     Status: None   Collection Time: 02/03/15  9:13 PM  Result Value Ref Range   Creatinine, Ser 0.84 0.50 - 1.35 mg/dL   GFR calc non Af Amer >90 >90 mL/min   GFR calc Af Amer >90 >90 mL/min  D-dimer, quantitative     Status: None   Collection Time: 02/03/15  9:13 PM  Result Value Ref Range   D-Dimer, Quant <0.27 0.00 - 0.48 ug/mL-FEU  Troponin I     Status: Abnormal   Collection Time: 02/04/15  4:36 AM  Result Value Ref Range   Troponin I 0.08 (H) <0.031 ng/mL  CBC     Status: Abnormal   Collection Time: 02/04/15  4:36 AM  Result Value Ref Range   WBC 6.4 4.0 - 10.5 K/uL   RBC 3.86 (L) 4.22 - 5.81 MIL/uL   Hemoglobin 12.2 (L) 13.0 - 17.0 g/dL   HCT 78.2 (L) 95.6 - 21.3 %   MCV 90.4 78.0 - 100.0 fL   MCH 31.6 26.0 - 34.0 pg   MCHC 35.0 30.0 - 36.0 g/dL   RDW 08.6 57.8 - 46.9 %   Platelets 369 150 - 400 K/uL  Comprehensive metabolic panel     Status: Abnormal   Collection Time: 02/04/15  4:36 AM  Result Value Ref Range   Sodium 139 135 - 145 mmol/L   Potassium 3.8 3.5 - 5.1 mmol/L   Chloride 108 96 - 112 mmol/L   CO2 27 19 - 32 mmol/L   Glucose, Bld 98 70 - 99 mg/dL   BUN 10 6 - 23 mg/dL   Creatinine, Ser 6.29 0.50 - 1.35 mg/dL   Calcium 9.0 8.4 - 52.8 mg/dL   Total Protein 5.9 (L) 6.0 - 8.3 g/dL   Albumin 3.4 (L) 3.5 - 5.2 g/dL   AST 16 0 - 37 U/L   ALT 14 0 - 53 U/L  Alkaline Phosphatase 61 39 - 117 U/L   Total Bilirubin 0.9 0.3 - 1.2 mg/dL   GFR calc non Af Amer >90 >90 mL/min   GFR calc Af Amer >90 >90 mL/min   Anion gap 4 (L) 5 - 15  Lipid panel     Status: Abnormal   Collection Time: 02/04/15  4:36 AM  Result Value Ref Range   Cholesterol 160 0 - 200 mg/dL   Triglycerides 015 <615 mg/dL   HDL 37 (L) >37 mg/dL   Total CHOL/HDL Ratio 4.3 RATIO   VLDL 23 0 - 40 mg/dL   LDL Cholesterol 943 (H) 0 - 99 mg/dL    Disposition:      Follow-up Information    Follow up with Lennette Bihari, MD.    Specialty:  Cardiology   Why:  office will call you   Contact information:   [address] [place] [address] [phone]       Discharge Medications:    Medication List    STOP taking these medications        CENTRUM SILVER ADULT 50+ PO     diazepam 5 MG tablet  Commonly known as:  VALIUM     ibuprofen 800 MG tablet  Commonly known as:  ADVIL,MOTRIN     oxyCODONE-acetaminophen 5-325 MG per tablet  Commonly known as:  PERCOCET/ROXICET      TAKE these medications        acetaminophen 325 MG tablet  Commonly known as:  TYLENOL  Take 2 tablets (650 mg total) by mouth every 4 (four) hours as needed for headache or mild pain.     ALEVE 220 MG tablet  Generic drug:  naproxen sodium  Take 220 mg by mouth 2 (two) times daily as needed (pain).     aspirin 81 MG EC tablet  Take 1 tablet (81 mg total) by mouth daily.     atenolol 50 MG tablet  Commonly known as:  TENORMIN  Take 1 tablet (50 mg total) by mouth daily.     atorvastatin 40 MG tablet  Commonly known as:  LIPITOR  Take 1 tablet (40 mg total) by mouth daily at 6 PM.     HYDROcodone-acetaminophen 5-325 MG per tablet  Commonly known as:  NORCO/VICODIN  1-2 tablets po q 6 hours prn moderate to severe pain     isosorbide mononitrate 30 MG 24 hr tablet  Commonly known as:  IMDUR  Take 0.5 tablets (15 mg total) by mouth daily.     naphazoline-glycerin 0.012-0.2 % Soln  Commonly known as:  CLEAR EYES  Place 1-2 drops into both eyes every 4 (four) hours as needed for irritation.     nitroGLYCERIN 0.4 MG SL tablet  Commonly known as:  NITROSTAT  Place 1 tablet (0.4 mg total) under the tongue every 5 (five) minutes x 3 doses as needed for chest pain.         Duration of Discharge Encounter: Greater than 30 minutes including physician time.  Jolene Provost PA-C 02/04/2015 10:18 AM

## 2015-02-04 NOTE — Progress Notes (Signed)
Patient ID: Shabaz Crouse, male   DOB: 05/27/1961, 54 y.o.   MRN: [id number]    Subjective:  Denies SSCP, palpitations or Dyspnea Wants to go home   Objective:  Filed Vitals:   02/03/15 1935 02/03/15 1950 02/03/15 2005 02/04/15 0506  BP: 133/90 135/88 137/91 118/79  Pulse: 75 71 71 72  Temp:    97.7 F (36.5 C)  TempSrc:    Oral  Resp: 11 11 14 16   Height:      Weight:    65.998 kg (145 lb 8 oz)  SpO2: 99% 98% 97% 97%    Intake/Output from previous day: No intake or output data in the 24 hours ending 02/04/15 0930  Physical Exam: Affect appropriate Healthy:  appears stated age HEENT: normal Neck supple with no adenopathy JVP normal no bruits no thyromegaly Lungs clear with no wheezing and good diaphragmatic motion Heart:  S1/S2 no murmur, no rub, gallop or click PMI normal Abdomen: benighn, BS positve, no tenderness, no AAA no bruit.  No HSM or HJR Distal pulses intact with no bruits No edema Neuro non-focal Skin warm and dry No muscular weakness Right radial sight well healed   Lab Results: Basic Metabolic Panel:  Recent Labs  46/27/03 1154 02/03/15 2113 02/04/15 0436  NA 139  --  139  K 4.0  --  3.8  CL 109  --  108  CO2 22  --  27  GLUCOSE 104*  --  98  BUN 12  --  10  CREATININE 0.88 0.84 0.82  CALCIUM 9.6  --  9.0  MG  --  2.0  --    Liver Function Tests:  Recent Labs  02/04/15 0436  AST 16  ALT 14  ALKPHOS 61  BILITOT 0.9  PROT 5.9*  ALBUMIN 3.4*   CBC:  Recent Labs  02/03/15 2113 02/04/15 0436  WBC 8.3 6.4  HGB 12.1* 12.2*  HCT 34.4* 34.9*  MCV 89.6 90.4  PLT 370 369   Cardiac Enzymes:  Recent Labs  02/03/15 1526 02/03/15 2113 02/04/15 0436  TROPONINI 0.19* 0.09* 0.08*   BNP: Invalid input(s): POCBNP D-Dimer:  Recent Labs  02/03/15 2113  DDIMER <0.27   Fasting Lipid Panel:  Recent Labs  02/04/15 0436  CHOL 160  HDL 37*  LDLCALC 100*  TRIG 113  CHOLHDL 4.3   Thyroid Function Tests:  Recent Labs  02/03/15 2113  TSH 2.468    Imaging: Dg Chest 2 View  02/03/2015   CLINICAL DATA:  Mid chest pain.  EXAM: CHEST  2 VIEW  COMPARISON:  11/22/2014  FINDINGS: There is hyperinflation of the lungs compatible with COPD. Heart and mediastinal contours are within normal limits. No focal opacities or effusions. No acute bony abnormality.  IMPRESSION: COPD.  No active disease.   Electronically Signed   By: Charlett Nose M.D.   On: 02/03/2015 13:31    Cardiac Studies:  ECG:    Telemetry:  SR no VT or arrhythmia 02/04/2015   Echo:  Can do as outpatient   Medications:   . adenosine  12 mL Intravenous Once  . amLODipine  5 mg Oral Daily  . aspirin EC  81 mg Oral Daily  . atorvastatin  40 mg Oral q1800  . carvedilol  6.25 mg Oral BID WC  . enoxaparin (LOVENOX) injection  40 mg Subcutaneous Q24H  . sodium chloride  3 mL Intravenous Q12H     . sodium chloride      Assessment/Plan:  CAD:  Mild bump in troponin  LAD moderate disease by FFR  No intervention ? Spasm  Continue ASA   He may not be compliant with meds.  Try to d/c with imdur 15 and atenolol 50 to simplify and treat Presumed SEMI and spasm.  D/C amlodipine and coreg (qd day dosing beta blocker better) Outpatient f/u with Dr Tresa Endo who did cath/FFR as DB mostly CHF  Can do echo as outpatient  Charlton Haws 02/04/2015, 9:30 AM

## 2015-02-04 NOTE — Progress Notes (Signed)
CARDIAC REHAB PHASE I   PRE:  Rate/Rhythm: 79 SR  BP:  Supine: 123/81  Sitting:   Standing:    SaO2:   MODE:  Ambulation: 350 ft   POST:  Rate/Rhythm: 70 SR  BP:  Supine:   Sitting: 131/86  Standing:    SaO2:  1000-1054 Pt walked 350 ft with steady gait. No CP.  Mi education completed with pt and wife who voiced understanding. Pt drinks a lot of regular coke a day and starts the day with [employer] sausage biscuit and eats fast food for lunch. We talked about cutting down on coke and making some healthier choices by taking own food. Pt stated he wants to see grand child grow up so he is willing to make some changes. Gave heart healthy diet and also encouraged not adding so much salt to food. Reviewed NTG use, Mi restrictions and ex also. Pt's wife very supportive and encouraging about trying to help pt make changes. Work schedule will not allow pt to attend CRP 2 as we discussed it also.   Luetta Nutting, RN BSN  02/04/2015 10:50 AM

## 2015-02-04 NOTE — Discharge Instructions (Addendum)
Acute Coronary Syndrome Acute coronary syndrome (ACS) is an urgent problem in which the blood and oxygen supply to the heart is critically deficient. ACS requires hospitalization because one or more coronary arteries may be blocked. ACS represents a range of conditions including:  Previous angina that is now unstable, lasts longer, happens at rest, or is more intense.  A heart attack, with heart muscle cell injury and death. There are three vital coronary arteries that supply the heart muscle with blood and oxygen so that it can pump blood effectively. If blockages to these arteries develop, blood flow to the heart muscle is reduced. If the heart does not get enough blood, angina may occur as the first warning sign. SYMPTOMS   The most common signs of angina include:  Tightness or squeezing in the chest.  Feeling of heaviness on the chest.  Discomfort in the arms, neck, back, or jaw.  Shortness of breath and nausea.  Cold, wet skin.  Angina is usually brought on by physical effort or excitement which increase the oxygen needs of the heart. These states increase the blood flow needs of the heart beyond what can be delivered.  Other symptoms that are not as common include:  Fatigue  Unexplained feelings of nervousness or anxiety  Weakness  Diarrhea  Sometimes, you may not have noticed any symptoms at all but still suffered a cardiac injury. TREATMENT   Medicines to help discomfort may include nitroglycerin (nitro) in the form of tablets or a spray for rapid relief, or longer-acting forms such as cream, patches, or capsules. (Be aware that there are many side effects and possible interactions with other drugs).  Other medicines may be used to help the heart pump better.  Procedures to open blocked arteries including angioplasty or stent placement to keep the arteries open.  Open heart surgery may be needed when there are many blockages or they are in critical locations that  are best treated with surgery. HOME CARE INSTRUCTIONS   Do not use any tobacco products including cigarettes, chewing tobacco, or electronic cigarettes.  Take one baby or adult aspirin daily, if your health care provider advises. This helps reduce the risk of a heart attack.  It is very important that you follow the angina treatment prescribed by your health care provider. Make arrangements for proper follow-up care.  Eat a heart healthy diet with salt and fat restrictions as advised.  Regular exercise is good for you as long as it does not cause discomfort. Do not begin any new type of exercise until you check with your health care provider.  If you are overweight, you should lose weight.  Try to maintain normal blood lipid levels.  Keep your blood pressure under control as recommended by your health care provider.  You should tell your health care provider right away about any increase in the severity or frequency of your chest discomfort or angina attacks. When you have angina, you should stop what you are doing and sit down. This may bring relief in 3 to 5 minutes. If your health care provider has prescribed nitro, take it as directed.  If your health care provider has given you a follow-up appointment, it is very important to keep that appointment. Not keeping the appointment could result in a chronic or permanent injury, pain, and disability. If there is any problem keeping the appointment, you must call back to this facility for assistance. SEEK IMMEDIATE MEDICAL CARE IF:   You develop nausea, vomiting, or shortness  of breath.  You feel faint, lightheaded, or pass out.  Your chest discomfort gets worse.  You are sweating or experience sudden profound fatigue.  You do not get relief of your chest pain after 3 doses of nitro.  Your discomfort lasts longer than 15 minutes. MAKE SURE YOU:   Understand these instructions.  Will watch your condition.  Will get help right  away if you are not doing well or get worse.  Take all medicines as directed by your health care provider. Document Released: 11/14/2005 Document Revised: 11/19/2013 Document Reviewed: 03/18/2014 [facility] Patient Information 2015 [place], [place]. This information is not intended to replace advice given to you by your health care provider. Make sure you discuss any questions you have with your health care provider.  Radial Site Care Refer to this sheet in the next few weeks. These instructions provide you with information on caring for yourself after your procedure. Your caregiver may also give you more specific instructions. Your treatment has been planned according to current medical practices, but problems sometimes occur. Call your caregiver if you have any problems or questions after your procedure. HOME CARE INSTRUCTIONS  You may shower the day after the procedure.Remove the bandage (dressing) and gently wash the site with plain soap and water.Gently pat the site dry.  Do not apply powder or lotion to the site.  Do not submerge the affected site in water for 3 to 5 days.  Inspect the site at least twice daily.  Do not flex or bend the affected arm for 24 hours.  No lifting over 5 pounds (2.3 kg) for 5 days after your procedure.  Do not drive home if you are discharged the same day of the procedure. Have someone else drive you.  You may drive 24 hours after the procedure unless otherwise instructed by your caregiver.  Do not operate machinery or power tools for 24 hours.  A responsible adult should be with you for the first 24 hours after you arrive home. What to expect:  Any bruising will usually fade within 1 to 2 weeks.  Blood that collects in the tissue (hematoma) may be painful to the touch. It should usually decrease in size and tenderness within 1 to 2 weeks. SEEK IMMEDIATE MEDICAL CARE IF:  You have unusual pain at the radial site.  You have redness, warmth,  swelling, or pain at the radial site.  You have drainage (other than a small amount of blood on the dressing).  You have chills.  You have a fever or persistent symptoms for more than 72 hours.  You have a fever and your symptoms suddenly get worse.  Your arm becomes pale, cool, tingly, or numb.  You have heavy bleeding from the site. Hold pressure on the site. Document Released: 12/17/2010 Document Revised: 02/06/2012 Document Reviewed: 12/17/2010 [facility] Patient Information 2015 [place], [place]. This information is not intended to replace advice given to you by your health care provider. Make sure you discuss any questions you have with your health care provider.

## 2015-02-05 LAB — HEMOGLOBIN A1C
HEMOGLOBIN A1C: 5.8 % — AB (ref 4.8–5.6)
MEAN PLASMA GLUCOSE: 120 mg/dL

## 2015-02-09 ENCOUNTER — Telehealth: Payer: Self-pay | Admitting: Cardiovascular Disease

## 2015-02-09 NOTE — Telephone Encounter (Signed)
Closed encounter °

## 2015-02-17 ENCOUNTER — Ambulatory Visit (INDEPENDENT_AMBULATORY_CARE_PROVIDER_SITE_OTHER): Payer: Self-pay | Admitting: Cardiology

## 2015-02-17 ENCOUNTER — Encounter: Payer: Self-pay | Admitting: Cardiology

## 2015-02-17 VITALS — BP 122/72 | HR 74 | Ht 69.0 in | Wt 150.8 lb

## 2015-02-17 DIAGNOSIS — Z8249 Family history of ischemic heart disease and other diseases of the circulatory system: Secondary | ICD-10-CM

## 2015-02-17 DIAGNOSIS — I249 Acute ischemic heart disease, unspecified: Secondary | ICD-10-CM

## 2015-02-17 DIAGNOSIS — Z87891 Personal history of nicotine dependence: Secondary | ICD-10-CM

## 2015-02-17 DIAGNOSIS — I214 Non-ST elevation (NSTEMI) myocardial infarction: Secondary | ICD-10-CM

## 2015-02-17 DIAGNOSIS — Z720 Tobacco use: Secondary | ICD-10-CM

## 2015-02-17 NOTE — Assessment & Plan Note (Addendum)
Quit Jan 2015

## 2015-02-17 NOTE — Progress Notes (Signed)
02/17/2015 Curtis Simpson   1961-06-02  [id number]  Primary Physician No PCP Per Patient Primary Cardiologist: Dr Tresa Endo  HPI:  54 y.o. male, Ex-smoker, quit Jan 2015, with no previous cardiac historyt, only known PMH is GERD and a FM Hx of CAD. 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 His EKG was normal but his troponin was elevated at 0.19. Cath done 02/03/15 showed a napkin ring LAD narrowing and an EF of 55%. No other significant CAD noted. Dr Tresa Endo performed FFR and it was felt the lesion was not significant. It is presumed the pt had coronary spasm. He is in the office today for follow up. He says he feels "great". No chest pain. He does not want to take Lipitor secondary to possible side effects. He says he has changed his diet (LDL was 100). He is tolerating his other medications.    Current Outpatient Prescriptions  Medication Sig Dispense Refill  . aspirin EC 81 MG EC tablet Take 1 tablet (81 mg total) by mouth daily.    Marland Kitchen atenolol (TENORMIN) 50 MG tablet Take 1 tablet (50 mg total) by mouth daily. 30 tablet 11  . isosorbide mononitrate (IMDUR) 30 MG 24 hr tablet Take 0.5 tablets (15 mg total) by mouth daily. 30 tablet 5  . Multiple Vitamins-Minerals (CENTRUM SILVER PO) Take 1 tablet by mouth daily.    . naproxen sodium (ALEVE) 220 MG tablet Take 220 mg by mouth 2 (two) times daily as needed (pain).    . nitroGLYCERIN (NITROSTAT) 0.4 MG SL tablet Place 1 tablet (0.4 mg total) under the tongue every 5 (five) minutes x 3 doses as needed for chest pain. 30 tablet 11   No current facility-administered medications for this visit.    Allergies  Allergen Reactions  . Morphine And Related Itching    Burning    History   Social History  . Marital Status: Married    Spouse Name: N/A  . Number of Children: N/A  . Years of Education: N/A   Occupational History  . Plumber    Social History Main Topics  .  Smoking status: Former Smoker -- 2.00 packs/day for 41 years    Types: Cigarettes    Quit date: 03/28/2012  . Smokeless tobacco: Never Used  . Alcohol Use: 0.0 oz/week    0 Standard drinks or equivalent per week     Comment: "heavy drinker til 1986"; may drink 1-2 beers/month now"  . Drug Use: Yes    Special: Marijuana     Comment: "smoked pot in my early years"  . Sexual Activity: Not on file   Other Topics Concern  . Not on file   Social History Narrative   Lives with wife in [place].     Review of Systems: General: negative for chills, fever, night sweats or weight changes.  Cardiovascular: negative for chest pain, dyspnea on exertion, edema, orthopnea, palpitations, paroxysmal nocturnal dyspnea or shortness of breath Dermatological: negative for rash Respiratory: negative for cough or wheezing Urologic: negative for hematuria Abdominal: negative for nausea, vomiting, diarrhea, bright red blood per rectum, melena, or hematemesis Neurologic: negative for visual changes, syncope, or dizziness All other systems reviewed and are otherwise negative except as noted above.    Blood pressure 122/72, pulse 74, height  (1.753 m), weight 150 lb 12.8 oz (68.402 kg).  General appearance: alert, cooperative and no distress Lungs: clear to auscultation bilaterally Heart:  regular rate and rhythm Extremities: no edema, pulses 3+   ASSESSMENT AND PLAN:   Acute coronary syndrome Presumed coronary spasm with Troponin 0.19, napkin ring LAD narrowing, not significant by FFR at cath 02/03/15   History of smoking Quit Jan 2015   Family history of coronary artery disease in father .   COPD on CXR .    PLAN  Same Rx. F/U with Dr Tresa Endo in a year. We discussed when to use SL NTG.   [facility] KPA-C 02/17/2015 1:57 PM

## 2015-02-17 NOTE — Patient Instructions (Signed)
Your physician recommends that you continue on your current medications as directed. Please refer to the Current Medication list given to you today.    Your physician wants you to follow-up in: ONE YEAR WITH DR Carmin Richmond will receive a reminder letter in the mail two months in advance. If you don't receive a letter, please call our office to schedule the follow-up appointment.

## 2015-02-17 NOTE — Assessment & Plan Note (Addendum)
Presumed coronary spasm with Troponin 0.19, napkin ring LAD narrowing, not significant by FFR at cath 02/03/15

## 2015-03-20 ENCOUNTER — Telehealth: Payer: Self-pay | Admitting: Cardiology

## 2015-03-20 NOTE — Telephone Encounter (Signed)
Returned call to patient's wife she stated husband has been having chest pain for the last 2 to 3 days.Stated he is presently at work and called her.Stated when he picks up heavy boxes he has chest pain.Stated this is the way he felt when he had his heart attack.Stated no chest pain at present.Stated she would like to schedule him a appointment.Appointment scheduled with Tereso Newcomer PA 03/24/15 at 10:20 am.Advised to go to ER if he has any more chest pain.

## 2015-03-20 NOTE — Telephone Encounter (Signed)
Pt c/o of Chest Pain: STAT if CP now or developed within 24 hours  1. Are you having CP right now? NO  2. Are you experiencing any other symptoms (ex. SOB, nausea, vomiting, sweating)? Denies any other symptoms   3. How long have you been experiencing CP? 2-3 times this week   4. Is your CP continuous or coming and going? Chest pain w/ exertion, coming and going   5. Have you taken Nitroglycerin? No ?

## 2015-03-24 ENCOUNTER — Ambulatory Visit: Payer: Self-pay | Admitting: Physician Assistant

## 2015-08-13 IMAGING — DX DG CHEST 2V
2 series · 2 of 2 positions shown · non-contrast
Comparison: 11/22/2014

CLINICAL DATA: Mid chest pain.

EXAM:
CHEST  2 VIEW

[chest pa]
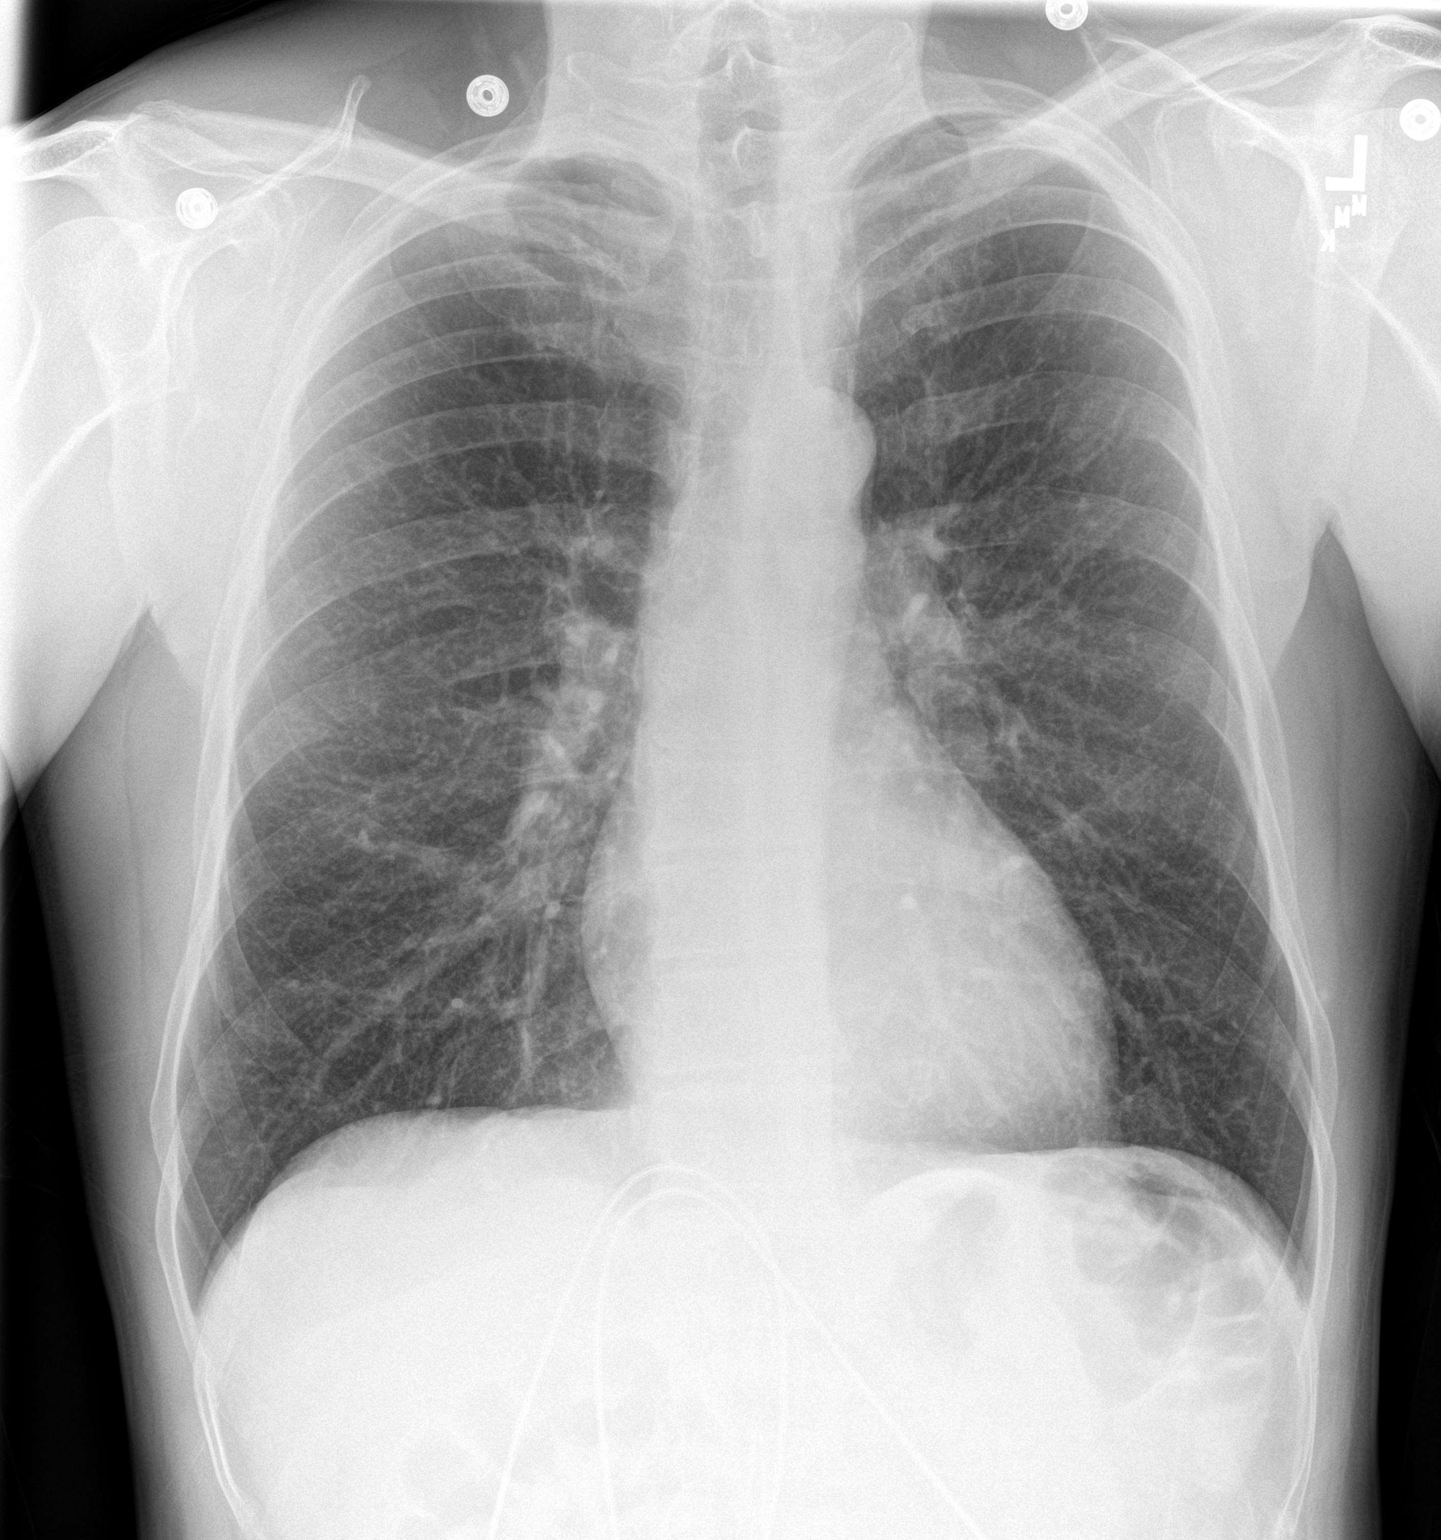

[chest lat]
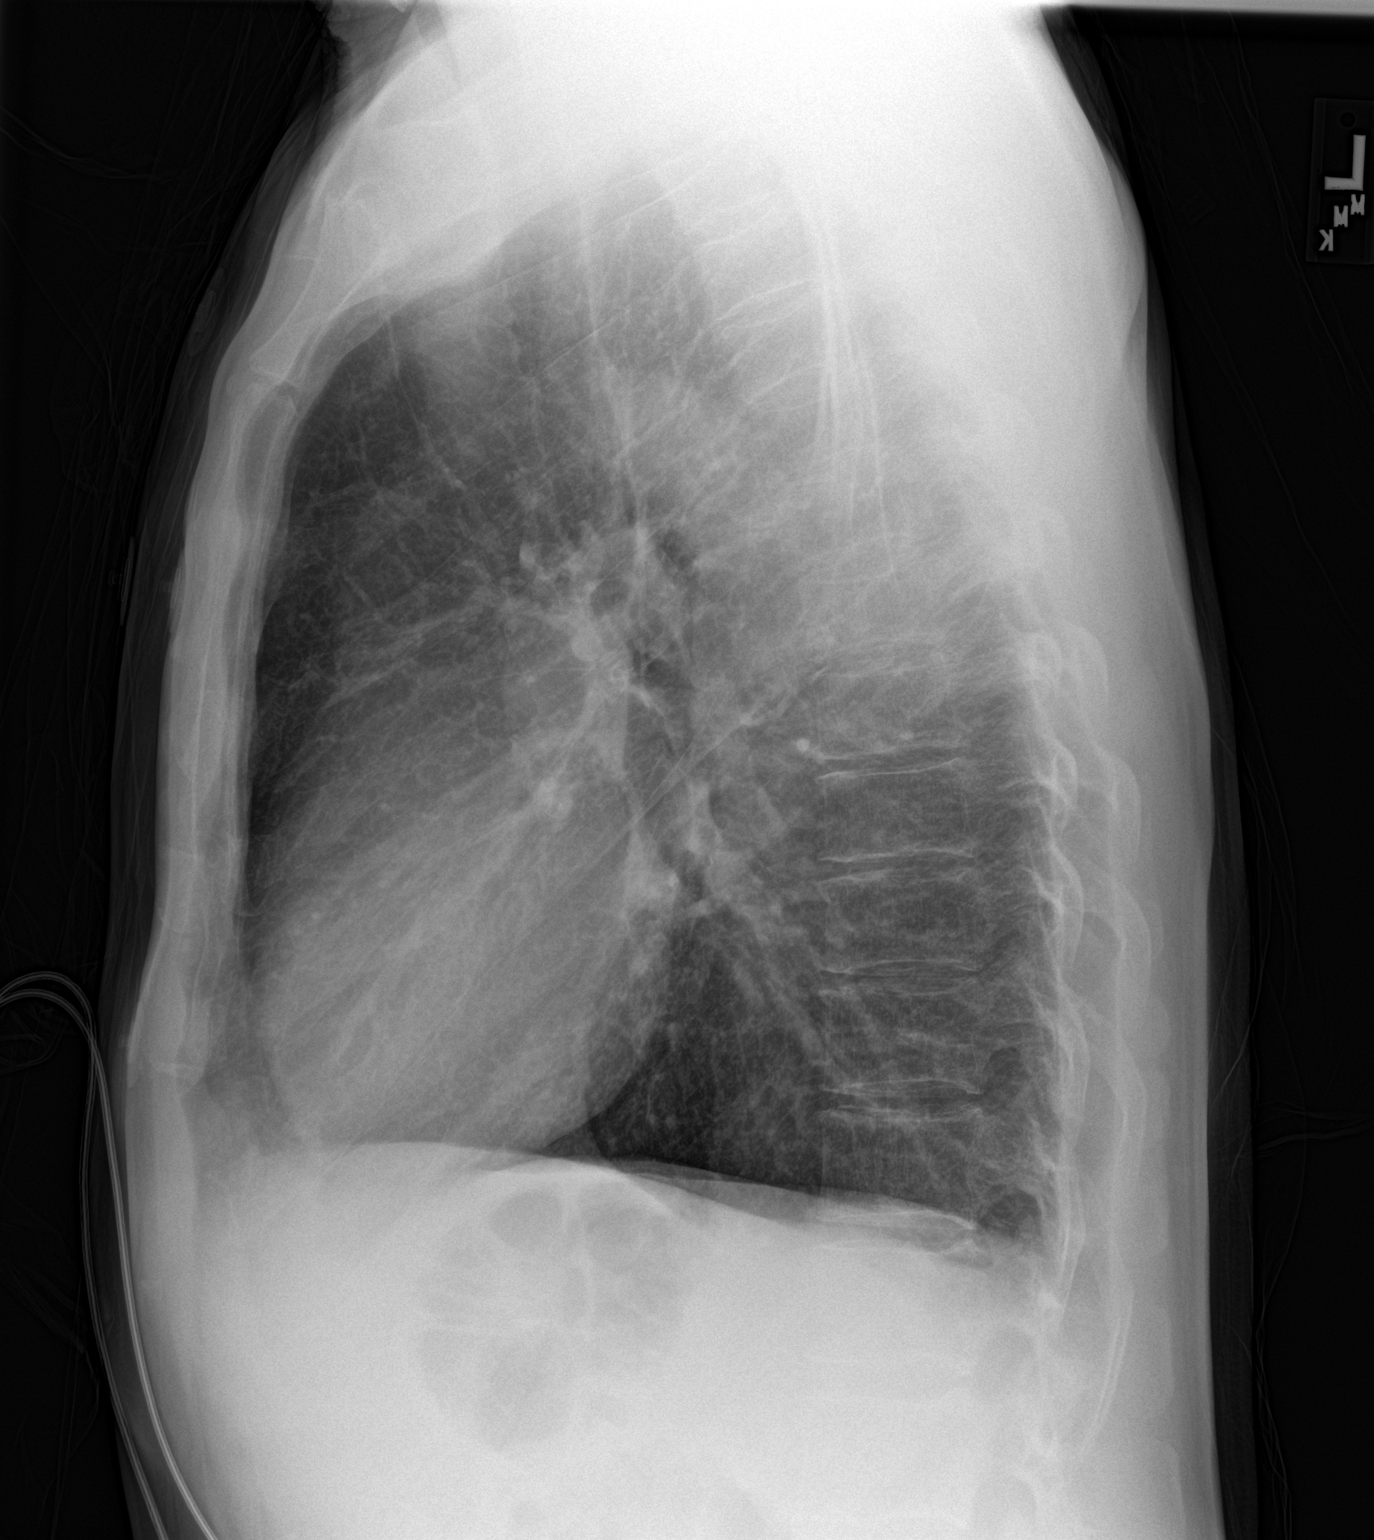

[2 of 2 positions shown; findings below may reference images not displayed]

FINDINGS: There is hyperinflation of the lungs compatible with COPD. Heart and
mediastinal contours are within normal limits. No focal opacities or
effusions. No acute bony abnormality.
IMPRESSION: COPD.  No active disease.

## 2015-11-28 ENCOUNTER — Other Ambulatory Visit: Payer: Self-pay | Admitting: Cardiology

## 2015-12-01 NOTE — Telephone Encounter (Signed)
Rx request sent to pharmacy.  

## 2016-02-02 ENCOUNTER — Other Ambulatory Visit: Payer: Self-pay | Admitting: Cardiology

## 2016-09-06 ENCOUNTER — Telehealth: Payer: Self-pay | Admitting: Cardiovascular Disease

## 2016-09-06 NOTE — Telephone Encounter (Signed)
Message sent to our pharmacist for alternative beta blocker.

## 2016-09-06 NOTE — Telephone Encounter (Signed)
Returned call to patient no answer.LMTC. 

## 2016-09-06 NOTE — Telephone Encounter (Signed)
Patient has not been seen since March 2016.  He will need appt with Dr. Tresa Endo.  In the meantime, switch to metoprolol succ 50 mg qd.

## 2016-09-06 NOTE — Telephone Encounter (Signed)
[facility] calling [phone], Atenolol has been backed ordered since like June, calling to see if can get alternate med

## 2016-09-07 ENCOUNTER — Other Ambulatory Visit: Payer: Self-pay | Admitting: Pharmacist Clinician (PhC)/ Clinical Pharmacy Specialist

## 2016-09-07 MED ORDER — METOPROLOL SUCCINATE ER 50 MG PO TB24: 50.0000 mg | ORAL_TABLET | Freq: Every day | ORAL | 2 refills | Status: DC

## 2016-09-07 NOTE — Telephone Encounter (Signed)
Returning your call from yesterday. °

## 2016-09-07 NOTE — Telephone Encounter (Signed)
Returned call to patient.Our pharmacist advised to switch atenolol to metoprolol succ 50 mg daily.Appointment scheduled with [facility] 09/23/16 at 8:30 am.

## 2016-09-07 NOTE — Telephone Encounter (Signed)
REFILL 

## 2016-09-23 ENCOUNTER — Ambulatory Visit: Payer: Self-pay | Admitting: Cardiovascular Disease

## 2017-01-08 ENCOUNTER — Other Ambulatory Visit: Payer: Self-pay | Admitting: Cardiovascular Disease

## 2017-01-14 ENCOUNTER — Other Ambulatory Visit: Payer: Self-pay | Admitting: Cardiology

## 2017-01-16 ENCOUNTER — Other Ambulatory Visit: Payer: Self-pay | Admitting: Cardiovascular Disease

## 2017-01-16 ENCOUNTER — Other Ambulatory Visit: Payer: Self-pay | Admitting: *Deleted

## 2017-05-18 ENCOUNTER — Other Ambulatory Visit: Payer: Self-pay | Admitting: Cardiovascular Disease

## 2017-05-22 ENCOUNTER — Telehealth: Payer: Self-pay | Admitting: Cardiovascular Disease

## 2017-05-22 MED ORDER — ISOSORBIDE MONONITRATE ER 30 MG PO TB24: 15.0000 mg | ORAL_TABLET | Freq: Every day | ORAL | 0 refills | Status: DC

## 2017-05-22 NOTE — Telephone Encounter (Signed)
Upcoming appt 7/2-refilled to last until appt.    Rx sent to preferred pharmacy.

## 2017-05-22 NOTE — Telephone Encounter (Signed)
New message   *STAT* If patient is at the pharmacy, call can be transferred to refill team.   1. Which medications need to be refilled? (please list name of each medication and dose if known) Isosorbide 30mg    2. Which pharmacy/location (including street and city if local pharmacy) is medication to be sent to?[facility] [place]  3. Do they need a 30 day or 90 day supply? 90

## 2017-05-29 ENCOUNTER — Ambulatory Visit: Payer: Self-pay | Admitting: Physician Assistant
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
## 2017-06-06 ENCOUNTER — Encounter: Payer: Self-pay | Admitting: *Deleted

## 2017-06-06 ENCOUNTER — Ambulatory Visit: Payer: Self-pay | Admitting: Physician Assistant
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
## 2017-07-03 ENCOUNTER — Other Ambulatory Visit: Payer: Self-pay | Admitting: Cardiovascular Disease

## 2017-07-03 MED ORDER — ISOSORBIDE MONONITRATE ER 30 MG PO TB24: 15.0000 mg | ORAL_TABLET | Freq: Every day | ORAL | 0 refills | Status: DC

## 2017-07-03 NOTE — Telephone Encounter (Signed)
Rx(s) sent to pharmacy electronically. Patient has PAOV 07/20/17 with Wynema Birch, PA

## 2017-07-03 NOTE — Telephone Encounter (Signed)
New message   *STAT* If patient is at the pharmacy, call can be transferred to refill team.   1. Which medications need to be refilled? (please list name of each medication and dose if known) isosorbide 30mg   2. Which pharmacy/location (including street and city if local pharmacy) is medication to be sent to? [facility] [place]  3. Do they need a 30 day or 90 day supply? 90

## 2017-07-20 ENCOUNTER — Ambulatory Visit (INDEPENDENT_AMBULATORY_CARE_PROVIDER_SITE_OTHER): Payer: Self-pay | Admitting: Physician Assistant

## 2017-07-20 ENCOUNTER — Encounter: Payer: Self-pay | Admitting: Physician Assistant

## 2017-07-20 ENCOUNTER — Other Ambulatory Visit: Payer: Self-pay | Admitting: Cardiovascular Disease

## 2017-07-20 VITALS — BP 120/78 | HR 61 | Ht 69.0 in | Wt 148.0 lb

## 2017-07-20 DIAGNOSIS — R0789 Other chest pain: Secondary | ICD-10-CM

## 2017-07-20 DIAGNOSIS — I25111 Atherosclerotic heart disease of native coronary artery with angina pectoris with documented spasm: Secondary | ICD-10-CM

## 2017-07-20 DIAGNOSIS — I10 Essential (primary) hypertension: Secondary | ICD-10-CM

## 2017-07-20 DIAGNOSIS — E785 Hyperlipidemia, unspecified: Secondary | ICD-10-CM

## 2017-07-20 LAB — COMPREHENSIVE METABOLIC PANEL
ALBUMIN: 4.5 g/dL (ref 3.5–5.5)
ALT: 17 IU/L (ref 0–44)
AST: 19 IU/L (ref 0–40)
Albumin/Globulin Ratio: 2 (ref 1.2–2.2)
Alkaline Phosphatase: 90 IU/L (ref 39–117)
BUN/Creatinine Ratio: 16 (ref 9–20)
BUN: 14 mg/dL (ref 6–24)
Bilirubin Total: 0.2 mg/dL (ref 0.0–1.2)
CO2: 22 mmol/L (ref 20–29)
CREATININE: 0.86 mg/dL (ref 0.76–1.27)
Calcium: 9.5 mg/dL (ref 8.7–10.2)
Chloride: 103 mmol/L (ref 96–106)
GFR calc Af Amer: 113 mL/min/{1.73_m2} (ref >59)
GFR, EST NON AFRICAN AMERICAN: 98 mL/min/{1.73_m2} (ref >59)
Globulin, Total: 2.3 g/dL (ref 1.5–4.5)
Glucose: 102 mg/dL — ABNORMAL HIGH (ref 65–99)
Potassium: 4.7 mmol/L (ref 3.5–5.2)
Sodium: 140 mmol/L (ref 134–144)
TOTAL PROTEIN: 6.8 g/dL (ref 6.0–8.5)

## 2017-07-20 LAB — CBC
Hematocrit: 39.2 % (ref 37.5–51.0)
Hemoglobin: 13.2 g/dL (ref 13.0–17.7)
MCH: 31 pg (ref 26.6–33.0)
MCHC: 33.7 g/dL (ref 31.5–35.7)
MCV: 92 fL (ref 79–97)
PLATELETS: 508 10*3/uL — AB (ref 150–379)
RBC: 4.26 x10E6/uL (ref 4.14–5.80)
RDW: 13.5 % (ref 12.3–15.4)
WBC: 8.6 10*3/uL (ref 3.4–10.8)

## 2017-07-20 LAB — TSH: TSH: 1.22 u[IU]/mL (ref 0.450–4.500)

## 2017-07-20 LAB — LIPID PANEL
Chol/HDL Ratio: 4.2 ratio (ref 0.0–5.0)
Cholesterol, Total: 172 mg/dL (ref 100–199)
HDL: 41 mg/dL (ref >39)
LDL Calculated: 108 mg/dL — ABNORMAL HIGH (ref 0–99)
TRIGLYCERIDES: 114 mg/dL (ref 0–149)
VLDL Cholesterol Cal: 23 mg/dL (ref 5–40)

## 2017-07-20 MED ORDER — ISOSORBIDE MONONITRATE ER 30 MG PO TB24: 15.0000 mg | ORAL_TABLET | Freq: Every day | ORAL | 3 refills | Status: DC

## 2017-07-20 MED ORDER — NITROGLYCERIN 0.4 MG SL SUBL: 0.4000 mg | SUBLINGUAL_TABLET | SUBLINGUAL | 3 refills | Status: DC | PRN

## 2017-07-20 NOTE — Progress Notes (Signed)
TSH normal

## 2017-07-20 NOTE — Patient Instructions (Signed)
Medication Instructions:  Your physician recommends that you continue on your current medications as directed. Please refer to the Current Medication list given to you today.  Labwork: Your physician recommends that you return for lab work in: TODAY-TSH, CBC, CMP, LIPID   Testing/Procedures: NONE   Follow-Up: Your physician wants you to follow-up in: 12 MONTHS WITH DR Tresa Endo. You will receive a reminder letter in the mail two months in advance. If you don't receive a letter, please call our office to schedule the follow-up appointment.  Any Other Special Instructions Will Be Listed Below (If Applicable).     If you need a refill on your cardiac medications before your next appointment, please call your pharmacy.
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
## 2017-07-21 ENCOUNTER — Encounter: Payer: Self-pay | Admitting: Physician Assistant

## 2017-07-24 ENCOUNTER — Telehealth: Payer: Self-pay | Admitting: Cardiovascular Disease

## 2017-07-24 NOTE — Telephone Encounter (Signed)
New message   Pt wife said that someone called them and did not leave message. She requests a call back

## 2017-07-24 NOTE — Telephone Encounter (Signed)
Returned the call to the patient's wife. She was not listed on the patient's DPR so the results could not be given to her. She stated that she will have the patient call back later.   Notes recorded by Azalee Course, PA on 07/20/2017 at 5:05 PM EDT TSH normal

## 2018-01-08 ENCOUNTER — Encounter (HOSPITAL_COMMUNITY): Payer: Self-pay | Admitting: Emergency Medicine

## 2018-01-08 ENCOUNTER — Emergency Department (HOSPITAL_COMMUNITY)
Admission: EM | Admit: 2018-01-08 | Discharge: 2018-01-08 | Disposition: A | Discharge status: Home / Self Care | Payer: Self-pay | Attending: Emergency Medicine | Admitting: Emergency Medicine

## 2018-01-08 ENCOUNTER — Emergency Department (HOSPITAL_COMMUNITY): Payer: Self-pay

## 2018-01-08 DIAGNOSIS — Z7982 Long term (current) use of aspirin: Secondary | ICD-10-CM | POA: Insufficient documentation

## 2018-01-08 DIAGNOSIS — J449 Chronic obstructive pulmonary disease, unspecified: Secondary | ICD-10-CM | POA: Insufficient documentation

## 2018-01-08 DIAGNOSIS — I252 Old myocardial infarction: Secondary | ICD-10-CM | POA: Insufficient documentation

## 2018-01-08 DIAGNOSIS — F17210 Nicotine dependence, cigarettes, uncomplicated: Secondary | ICD-10-CM | POA: Insufficient documentation

## 2018-01-08 DIAGNOSIS — R079 Chest pain, unspecified: Secondary | ICD-10-CM | POA: Insufficient documentation

## 2018-01-08 DIAGNOSIS — Z79899 Other long term (current) drug therapy: Secondary | ICD-10-CM | POA: Insufficient documentation

## 2018-01-08 LAB — I-STAT TROPONIN, ED
Troponin i, poc: 0 ng/mL (ref 0.00–0.08)
Troponin i, poc: 0 ng/mL (ref 0.00–0.08)

## 2018-01-08 LAB — BASIC METABOLIC PANEL
Anion gap: 13 (ref 5–15)
BUN: 15 mg/dL (ref 6–20)
CHLORIDE: 104 mmol/L (ref 101–111)
CO2: 19 mmol/L — ABNORMAL LOW (ref 22–32)
Calcium: 9.5 mg/dL (ref 8.9–10.3)
Creatinine, Ser: 1.01 mg/dL (ref 0.61–1.24)
Glucose, Bld: 117 mg/dL — ABNORMAL HIGH (ref 65–99)
POTASSIUM: 3.6 mmol/L (ref 3.5–5.1)
SODIUM: 136 mmol/L (ref 135–145)

## 2018-01-08 LAB — CBC
HCT: 39.9 % (ref 39.0–52.0)
Hemoglobin: 14 g/dL (ref 13.0–17.0)
MCH: 32.3 pg (ref 26.0–34.0)
MCHC: 35.1 g/dL (ref 30.0–36.0)
MCV: 91.9 fL (ref 78.0–100.0)
Platelets: 461 K/uL — ABNORMAL HIGH (ref 150–400)
RBC: 4.34 MIL/uL (ref 4.22–5.81)
RDW: 12.9 % (ref 11.5–15.5)
WBC: 14.4 K/uL — ABNORMAL HIGH (ref 4.0–10.5)

## 2018-01-08 NOTE — Discharge Instructions (Signed)
Take a whole tablet of Imdur.  Dr. Landry Dyke office will call you to schedule appointment for evaluation.

## 2018-01-08 NOTE — ED Notes (Signed)
Pt voices understanding of discharge information. NAD. A/o x4.

## 2018-01-08 NOTE — ED Triage Notes (Signed)
Patient arrived with EMS from home woke up this morning with  central chest pressure , diaphoresis and emesis . He received ASA 324 mg by EMS , denies pain at arrival . His cardiologist is Dr. Tresa Endo .
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
## 2018-01-10 ENCOUNTER — Ambulatory Visit (INDEPENDENT_AMBULATORY_CARE_PROVIDER_SITE_OTHER): Payer: Self-pay | Admitting: Physician Assistant

## 2018-01-10 ENCOUNTER — Encounter: Payer: Self-pay | Admitting: Physician Assistant

## 2018-01-10 VITALS — BP 125/80 | HR 75 | Ht 69.0 in | Wt 144.6 lb

## 2018-01-10 DIAGNOSIS — R079 Chest pain, unspecified: Secondary | ICD-10-CM

## 2018-01-10 DIAGNOSIS — I10 Essential (primary) hypertension: Secondary | ICD-10-CM

## 2018-01-10 MED ORDER — ISOSORBIDE MONONITRATE ER 60 MG PO TB24: 60.0000 mg | ORAL_TABLET | Freq: Every day | ORAL | 11 refills | Status: DC

## 2018-01-10 NOTE — Patient Instructions (Signed)
Medication Instructions:  INCREASE- Isosorbide 60 mg daily  If you need a refill on your cardiac medications before your next appointment, please call your pharmacy.  Labwork: None Ordered   Testing/Procedures: None Ordered  Follow-Up: Your physician wants you to follow-up in: 2 weeks with Dr Tresa Endo or Azalee Course.     Thank you for choosing [facility] at [facility]!!
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
## 2018-01-12 ENCOUNTER — Encounter: Payer: Self-pay | Admitting: Physician Assistant

## 2018-01-25 ENCOUNTER — Ambulatory Visit: Payer: Self-pay | Admitting: Physician Assistant

## 2018-07-18 IMAGING — CR DG CHEST 2V
2 series · 2 of 2 positions shown · non-contrast
Comparison: Chest radiograph performed 02/03/2015

CLINICAL DATA: Acute onset of generalized chest pain and vomiting.

EXAM:
CHEST  2 VIEW

[chest pa]
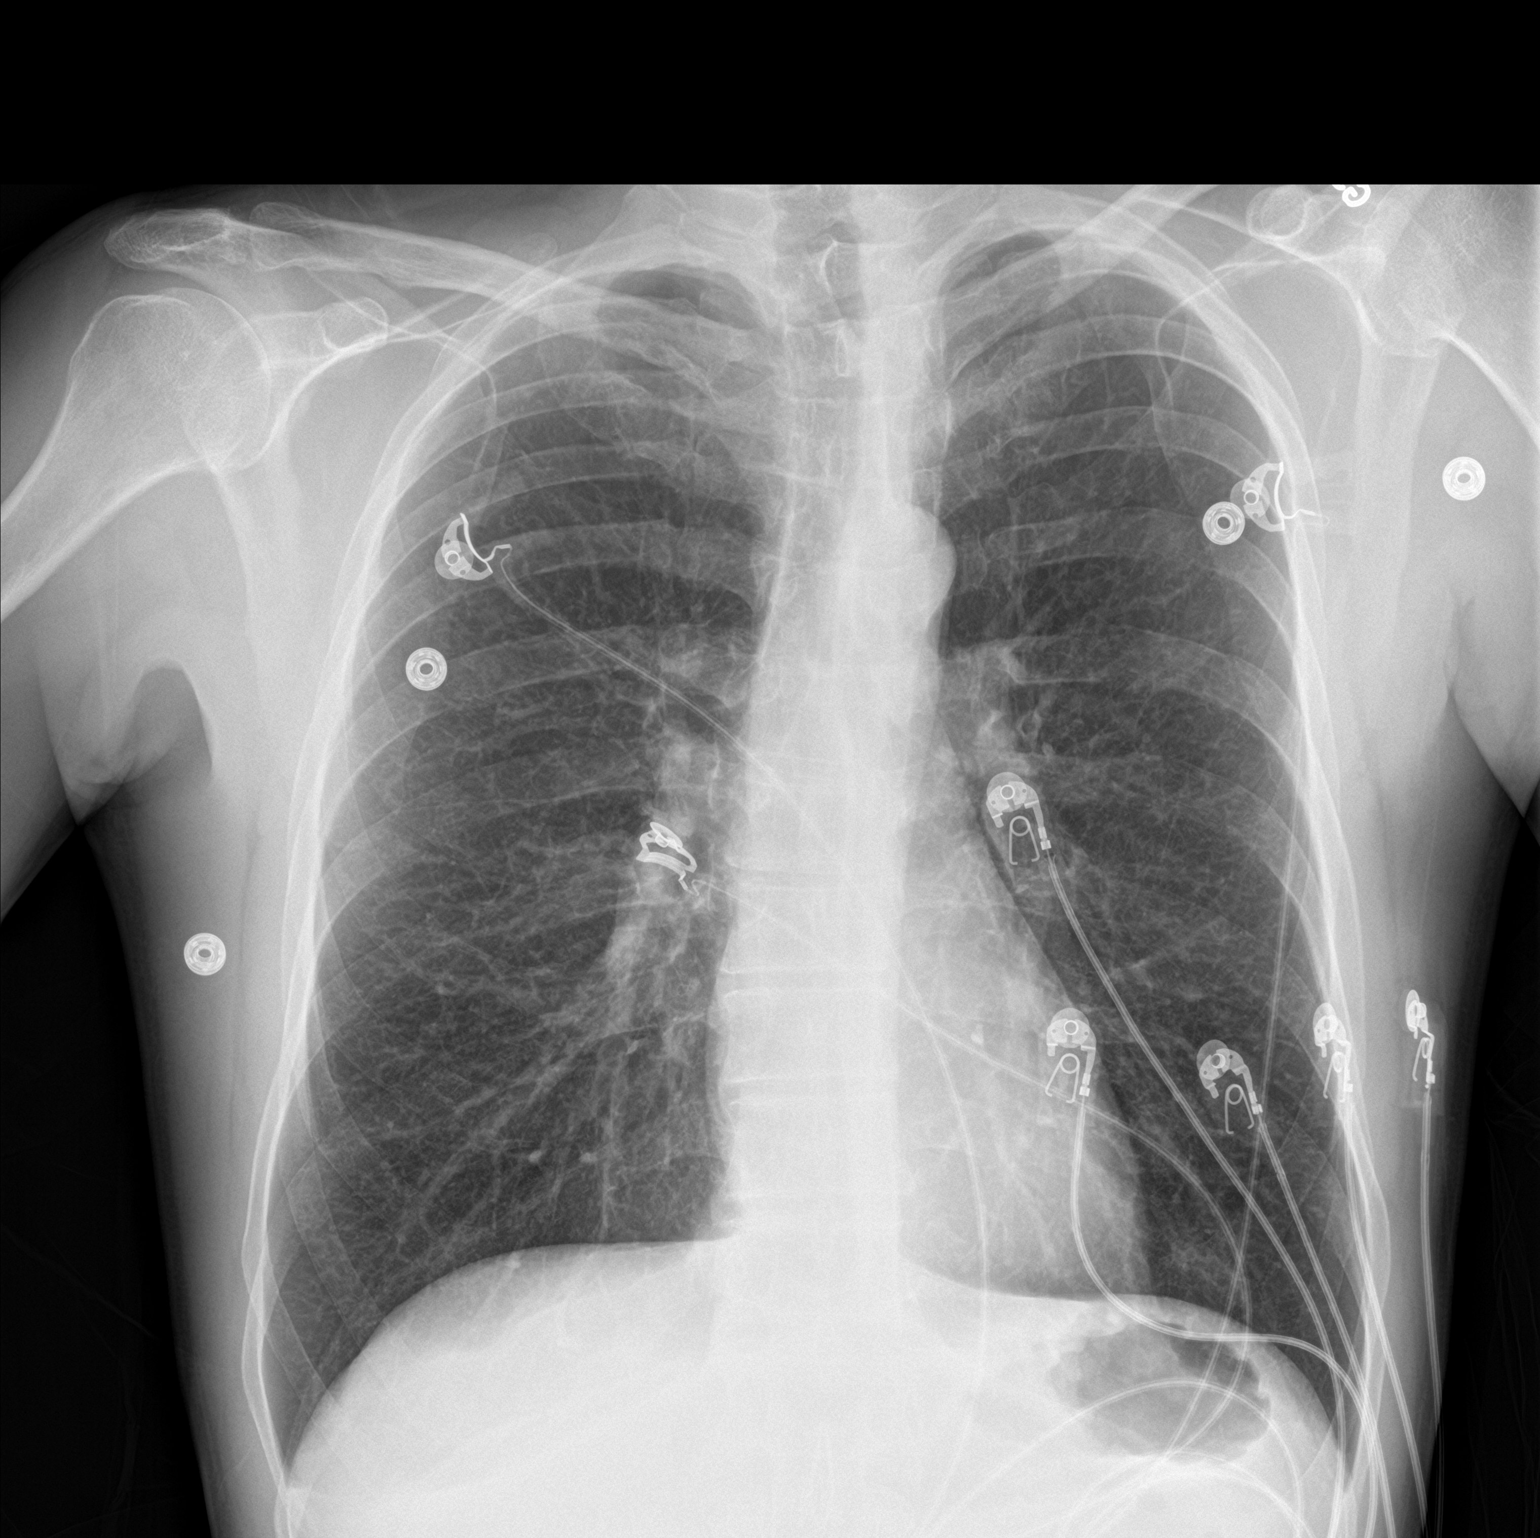

[chest lat]
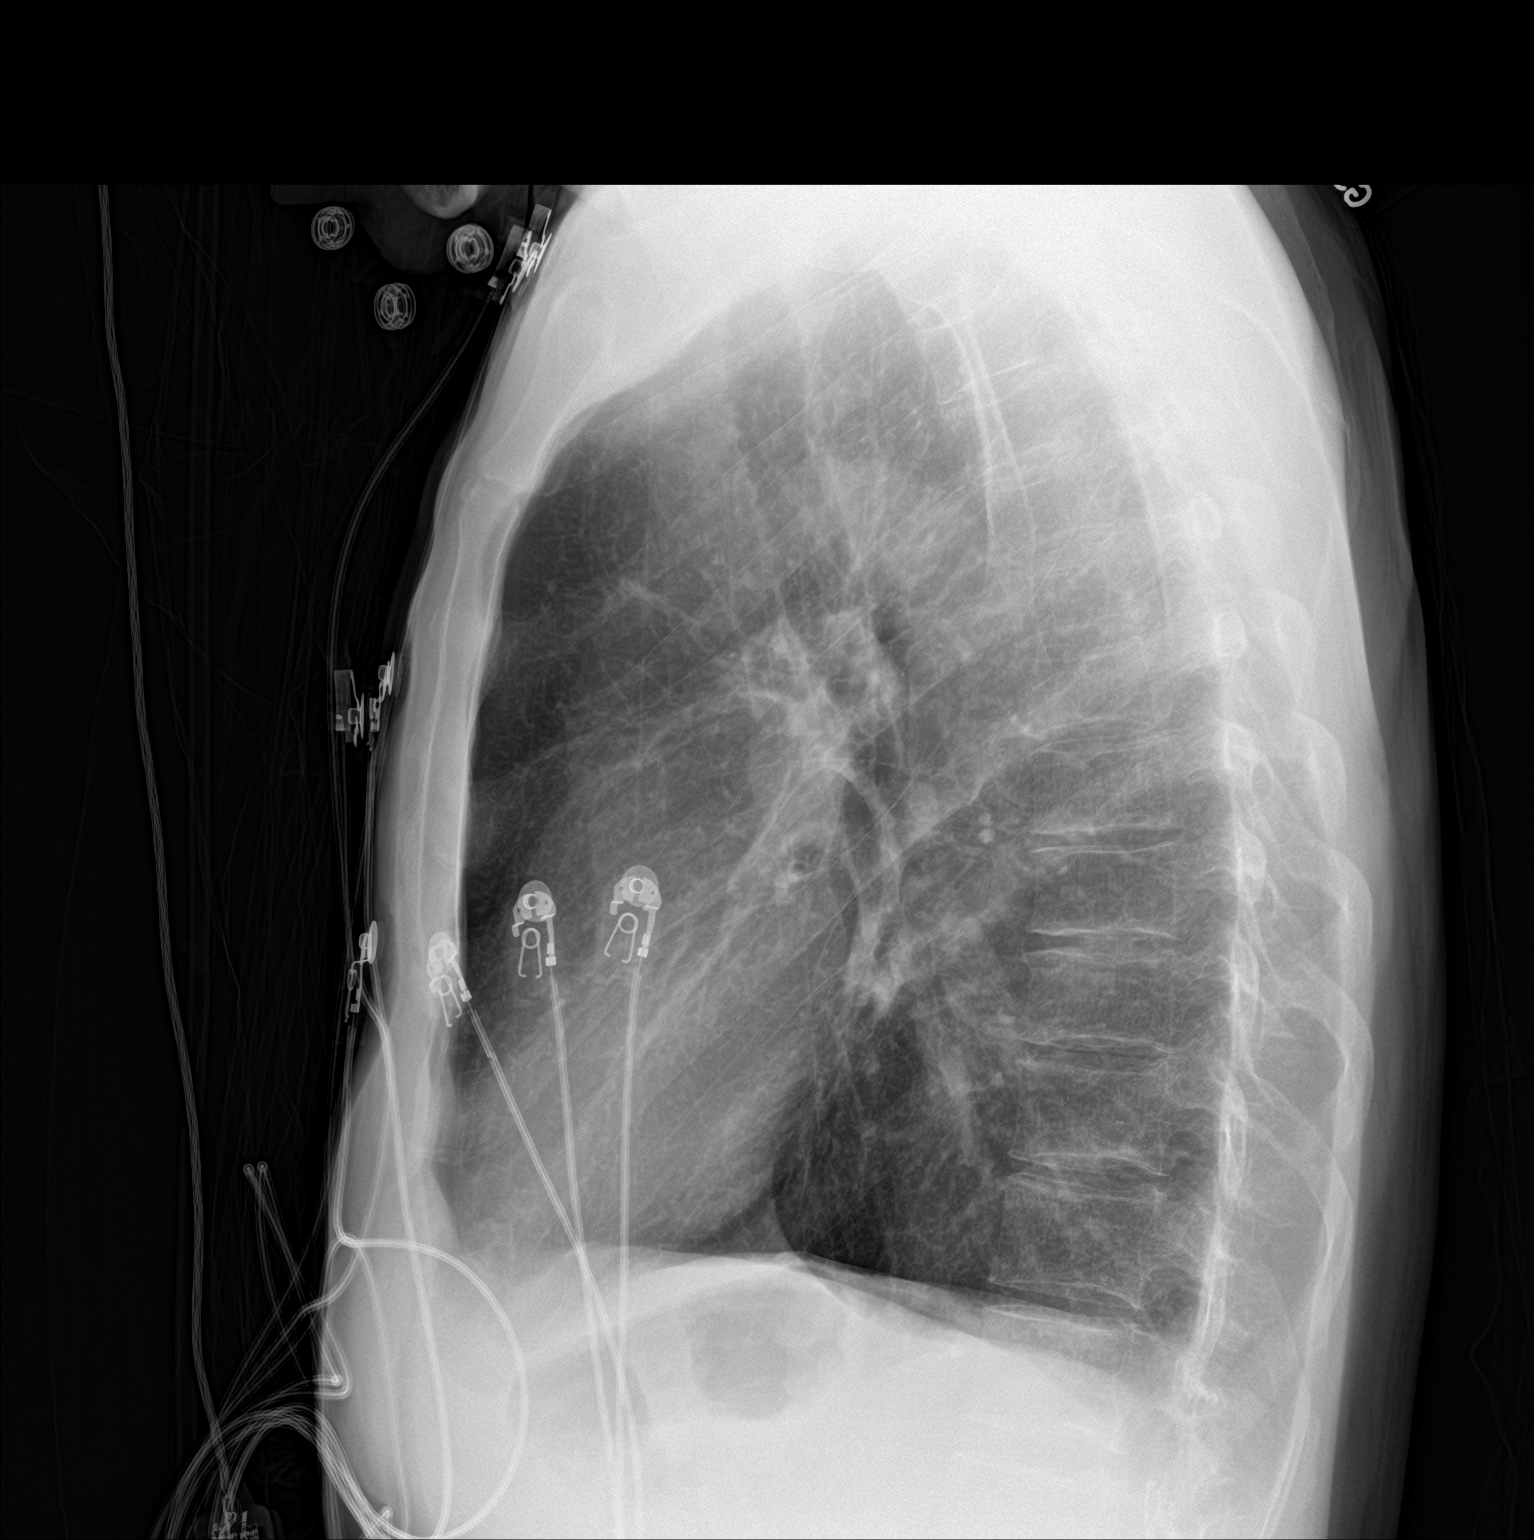

[2 of 2 positions shown; findings below may reference images not displayed]

FINDINGS: The lungs are well-aerated and clear. There is no evidence of focal
opacification, pleural effusion or pneumothorax.

The heart is normal in size; the mediastinal contour is within
normal limits. No acute osseous abnormalities are seen.
IMPRESSION: No acute cardiopulmonary process seen.

## 2019-02-22 ENCOUNTER — Telehealth: Payer: Self-pay | Admitting: Physician Assistant

## 2019-02-22 NOTE — Telephone Encounter (Signed)
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 INFORMED THAT WE ARE NOT PROVIDING LETTERS TO ANY OF OUR PT. WIFE STATES THAT HE HAS HAD "THE FLU" SINCE ON AND OFF SINCE JAN AND THEY ARE WORRIED BECAUSE THEY HAVE A SPECIAL NEEDS BABY. PLEASE ADVISE ON LETTER.

## 2019-02-22 NOTE — Telephone Encounter (Signed)
I have not seen this patient in over a year. Also if it is cough that is preventing him to work, this is something a primary care provider need to handle. If he does not have one, he will need to establish with one.

## 2019-02-22 NOTE — Telephone Encounter (Signed)
New Message:.      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 Pt says he needs a letter stating that it is not safe with his condition to work in this environment please.

## 2019-02-22 NOTE — Telephone Encounter (Signed)
Pt/wife informed of providers result & recommendations. Pt wife verbalized understanding. Verbalized thanks for considering.

## 2019-12-02 ENCOUNTER — Other Ambulatory Visit: Payer: Self-pay | Admitting: Physician Assistant

## 2020-05-25 ENCOUNTER — Encounter: Payer: Self-pay | Admitting: Cardiovascular Disease

## 2020-05-25 ENCOUNTER — Other Ambulatory Visit: Payer: Self-pay

## 2020-05-25 ENCOUNTER — Ambulatory Visit (INDEPENDENT_AMBULATORY_CARE_PROVIDER_SITE_OTHER): Payer: Self-pay | Admitting: Cardiovascular Disease

## 2020-05-25 DIAGNOSIS — Z87891 Personal history of nicotine dependence: Secondary | ICD-10-CM

## 2020-05-25 DIAGNOSIS — Z8249 Family history of ischemic heart disease and other diseases of the circulatory system: Secondary | ICD-10-CM

## 2020-05-25 DIAGNOSIS — I201 Angina pectoris with documented spasm: Secondary | ICD-10-CM

## 2020-05-25 DIAGNOSIS — I249 Acute ischemic heart disease, unspecified: Secondary | ICD-10-CM

## 2020-05-25 DIAGNOSIS — I10 Essential (primary) hypertension: Secondary | ICD-10-CM

## 2020-05-25 DIAGNOSIS — R0789 Other chest pain: Secondary | ICD-10-CM

## 2020-05-25 MED ORDER — NITROGLYCERIN 0.4 MG SL SUBL: SUBLINGUAL_TABLET | SUBLINGUAL | 3 refills | Status: DC

## 2020-05-25 MED ORDER — AMLODIPINE BESYLATE 2.5 MG PO TABS: 2.5000 mg | ORAL_TABLET | Freq: Every day | ORAL | 3 refills | Status: DC
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
## 2020-05-25 NOTE — Patient Instructions (Signed)
Medication Instructions:  BEGIN TAKING AMLODIPINE 2.5MG  DAILY  *If you need a refill on your cardiac medications before your next appointment, please call your pharmacy*   Lab Work: FASTING LABS: CMET CBC TSH LIPID  If you have labs (blood work) drawn today and your tests are completely normal, you will receive your results only by: Marland Kitchen MyChart Message (if you have MyChart) OR . A paper copy in the mail If you have any lab test that is abnormal or we need to change your treatment, we will call you to review the results.   Testing/Procedures: Your physician has requested that you have an echocardiogram. Echocardiography is a painless test that uses sound waves to create images of your heart. It provides your doctor with information about the size and shape of your heart and how well your heart's chambers and valves are working. This procedure takes approximately one hour. There are no restrictions for this procedure.     Follow-Up: At [facility], you and your health needs are our priority.  As part of our continuing mission to provide you with exceptional heart care, we have created designated Provider Care Teams.  These Care Teams include your primary Cardiologist (physician) and Advanced Practice Providers (APPs -  Physician Assistants and Nurse Practitioners) who all work together to provide you with the care you need, when you need it.  We recommend signing up for the patient portal called "MyChart".  Sign up information is provided on this After Visit Summary.  MyChart is used to connect with patients for Virtual Visits (Telemedicine).  Patients are able to view lab/test results, encounter notes, upcoming appointments, etc.  Non-urgent messages can be sent to your provider as well.   To learn more about what you can do with MyChart, go to [email or web address].    Your next appointment:   3-4 month(s)  The format for your next appointment:   In Person  Provider:   Nicki Guadalajara, MD

## 2020-06-05 NOTE — Addendum Note (Signed)
Addended by: Orlene Och on: 06/05/2020 04:16 PM   Modules accepted: Orders

## 2020-06-13 ENCOUNTER — Other Ambulatory Visit: Payer: Self-pay | Admitting: Physician Assistant

## 2020-06-16 LAB — COMPREHENSIVE METABOLIC PANEL
ALT: 13 IU/L (ref 0–44)
AST: 17 IU/L (ref 0–40)
Albumin/Globulin Ratio: 1.8 (ref 1.2–2.2)
Albumin: 4.2 g/dL (ref 3.8–4.9)
Alkaline Phosphatase: 103 IU/L (ref 48–121)
BUN/Creatinine Ratio: 19 (ref 9–20)
BUN: 19 mg/dL (ref 6–24)
Bilirubin Total: <0.2 mg/dL (ref 0.0–1.2)
CO2: 20 mmol/L (ref 20–29)
Calcium: 9.1 mg/dL (ref 8.7–10.2)
Chloride: 105 mmol/L (ref 96–106)
Creatinine, Ser: 0.99 mg/dL (ref 0.76–1.27)
GFR calc Af Amer: 97 mL/min/{1.73_m2} (ref >59)
GFR calc non Af Amer: 84 mL/min/{1.73_m2} (ref >59)
Globulin, Total: 2.4 g/dL (ref 1.5–4.5)
Glucose: 99 mg/dL (ref 65–99)
Potassium: 4.7 mmol/L (ref 3.5–5.2)
Sodium: 138 mmol/L (ref 134–144)
Total Protein: 6.6 g/dL (ref 6.0–8.5)

## 2020-06-16 LAB — CBC
Hematocrit: 35.6 % — ABNORMAL LOW (ref 37.5–51.0)
Hemoglobin: 12.5 g/dL — ABNORMAL LOW (ref 13.0–17.7)
MCH: 32.3 pg (ref 26.6–33.0)
MCHC: 35.1 g/dL (ref 31.5–35.7)
MCV: 92 fL (ref 79–97)
Platelets: 447 10*3/uL (ref 150–450)
RBC: 3.87 x10E6/uL — ABNORMAL LOW (ref 4.14–5.80)
RDW: 13.5 % (ref 11.6–15.4)
WBC: 8.2 10*3/uL (ref 3.4–10.8)

## 2020-06-16 LAB — LIPID PANEL
Chol/HDL Ratio: 5.1 ratio — ABNORMAL HIGH (ref 0.0–5.0)
Cholesterol, Total: 188 mg/dL (ref 100–199)
HDL: 37 mg/dL — ABNORMAL LOW (ref >39)
LDL Chol Calc (NIH): 112 mg/dL — ABNORMAL HIGH (ref 0–99)
Triglycerides: 221 mg/dL — ABNORMAL HIGH (ref 0–149)
VLDL Cholesterol Cal: 39 mg/dL (ref 5–40)

## 2020-06-16 LAB — TSH: TSH: 1.67 u[IU]/mL (ref 0.450–4.500)

## 2020-06-17 ENCOUNTER — Ambulatory Visit (HOSPITAL_COMMUNITY): Payer: Self-pay | Attending: Cardiovascular Disease

## 2020-06-17 ENCOUNTER — Other Ambulatory Visit: Payer: Self-pay

## 2020-06-17 DIAGNOSIS — R0789 Other chest pain: Secondary | ICD-10-CM | POA: Insufficient documentation

## 2020-06-17 DIAGNOSIS — I249 Acute ischemic heart disease, unspecified: Secondary | ICD-10-CM | POA: Insufficient documentation

## 2020-06-17 DIAGNOSIS — Z87891 Personal history of nicotine dependence: Secondary | ICD-10-CM | POA: Insufficient documentation

## 2020-06-17 DIAGNOSIS — Z8249 Family history of ischemic heart disease and other diseases of the circulatory system: Secondary | ICD-10-CM | POA: Insufficient documentation

## 2020-06-17 LAB — ECHOCARDIOGRAM COMPLETE
Area-P 1/2: 4.17 cm2
S' Lateral: 2.9 cm

## 2020-06-25 ENCOUNTER — Other Ambulatory Visit: Payer: Self-pay

## 2020-06-25 MED ORDER — ROSUVASTATIN CALCIUM 20 MG PO TABS: 20.0000 mg | ORAL_TABLET | Freq: Every day | ORAL | 2 refills | Status: DC

## 2020-06-25 NOTE — Progress Notes (Signed)
Prescription for Rosuvastatin 20mg  placed and sent to pharmacy.

## 2020-09-24 ENCOUNTER — Ambulatory Visit: Payer: Self-pay | Admitting: Cardiovascular Disease

## 2020-09-30 ENCOUNTER — Other Ambulatory Visit: Payer: Self-pay | Admitting: Cardiovascular Disease

## 2021-01-01 ENCOUNTER — Other Ambulatory Visit: Payer: Self-pay | Admitting: Cardiovascular Disease

## 2021-04-05 ENCOUNTER — Other Ambulatory Visit: Payer: Self-pay | Admitting: Cardiovascular Disease

## 2021-07-26 ENCOUNTER — Other Ambulatory Visit: Payer: Self-pay

## 2021-07-26 ENCOUNTER — Ambulatory Visit (INDEPENDENT_AMBULATORY_CARE_PROVIDER_SITE_OTHER): Payer: Self-pay | Admitting: Cardiovascular Disease

## 2021-07-26 ENCOUNTER — Encounter: Payer: Self-pay | Admitting: Cardiovascular Disease

## 2021-07-26 VITALS — BP 130/82 | HR 65 | Ht 69.0 in | Wt 155.2 lb

## 2021-07-26 DIAGNOSIS — I249 Acute ischemic heart disease, unspecified: Secondary | ICD-10-CM

## 2021-07-26 DIAGNOSIS — I201 Angina pectoris with documented spasm: Secondary | ICD-10-CM

## 2021-07-26 DIAGNOSIS — Z87891 Personal history of nicotine dependence: Secondary | ICD-10-CM

## 2021-07-26 DIAGNOSIS — Z79899 Other long term (current) drug therapy: Secondary | ICD-10-CM

## 2021-07-26 DIAGNOSIS — I10 Essential (primary) hypertension: Secondary | ICD-10-CM
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
## 2021-07-26 NOTE — Patient Instructions (Signed)
Medication Instructions:  DECREASE- Isosorbide 30 mg by mouth daily  *If you need a refill on your cardiac medications before your next appointment, please call your pharmacy*   Lab Work: CBC, CMP, TSH and Lipids  If you have labs (blood work) drawn today and your tests are completely normal, you will receive your results only by: MyChart Message (if you have MyChart) OR A paper copy in the mail If you have any lab test that is abnormal or we need to change your treatment, we will call you to review the results.   Testing/Procedures: None Ordered   Follow-Up: At [facility], you and your health needs are our priority.  As part of our continuing mission to provide you with exceptional heart care, we have created designated Provider Care Teams.  These Care Teams include your primary Cardiologist (physician) and Advanced Practice Providers (APPs -  Physician Assistants and Nurse Practitioners) who all work together to provide you with the care you need, when you need it.  We recommend signing up for the patient portal called "MyChart".  Sign up information is provided on this After Visit Summary.  MyChart is used to connect with patients for Virtual Visits (Telemedicine).  Patients are able to view lab/test results, encounter notes, upcoming appointments, etc.  Non-urgent messages can be sent to your provider as well.   To learn more about what you can do with MyChart, go to [email or web address].    Your next appointment:   1 year(s)  The format for your next appointment:   In Person  Provider:   You may see Nicki Guadalajara, MD or one of the following Advanced Practice Providers on your designated Care Team:   Azalee Course, PA-C Micah Flesher, PA-C or  Judy Pimple, [place]

## 2021-07-28 ENCOUNTER — Encounter: Payer: Self-pay | Admitting: Cardiovascular Disease

## 2021-08-04 ENCOUNTER — Other Ambulatory Visit: Payer: Self-pay | Admitting: Cardiovascular Disease

## 2021-08-10 ENCOUNTER — Other Ambulatory Visit: Payer: Self-pay | Admitting: Cardiovascular Disease

## 2021-09-29 ENCOUNTER — Other Ambulatory Visit: Payer: Self-pay | Admitting: Cardiovascular Disease

## 2021-09-29 DIAGNOSIS — R0789 Other chest pain: Secondary | ICD-10-CM

## 2021-11-26 ENCOUNTER — Other Ambulatory Visit: Payer: Self-pay | Admitting: Cardiovascular Disease

## 2022-04-20 ENCOUNTER — Other Ambulatory Visit: Payer: Self-pay | Admitting: Cardiovascular Disease

## 2022-08-27 ENCOUNTER — Other Ambulatory Visit: Payer: Self-pay | Admitting: Cardiovascular Disease

## 2022-11-23 ENCOUNTER — Other Ambulatory Visit: Payer: Self-pay | Admitting: Cardiovascular Disease

## 2023-01-26 NOTE — Progress Notes (Signed)
Cardiology Clinic Note   Date of Encounter: 01/30/2023  Primary Care Provider:  Patient, No Pcp Per Primary Cardiologist:  Shelva Majestic, MD  Patient Profile    Curtis Simpson is a 62 y.o. male who presents to the clinic today for routine follow-up of chronic cardiac conditions.   Past medical history significant for: Possible coronary vasospasm.  LHC 02/03/15 (unstable angina): Normal coronaries except for apparent napkin-ring lesion in ostial LAD at least 70%.  Normal EF.  FFR of LAD: Probable significant coronary vasospasm in the etiology of the stenosis noted at the diagnostic cath, not felt to be physiologically significant with FFR. Echo 06/17/2020: EF 60 to 65%.  Mild concentric LVH.  Trivial MR.  Trivial AR. Hypertension.  Hyperlipidemia.  GERD.    History of Present Illness    Curtis Simpson was first evaluated by cardiology on 02/03/2015 during hospitalization for midsternal exertional chest pain.  Troponin elevated EKG did not show acute ischemic changes.  Patient underwent LHC and thought to have significant coronary vasospasm (details above).  Patient was started on aspirin, atorvastatin, isosorbide, and as needed NTG at discharge.  During routine follow-up with Dr. Claiborne Billings in June 2021 patient reported mild exertional dyspnea with significant activity.  His BP was also elevated.  Amlodipine was added and he underwent echo which showed normal left ventricular function and mild LVH (detailed above).  Patient was last seen in the office on 07/26/2021 by Dr. Claiborne Billings.  He was doing well at that time.  Isosorbide reduced to 30 mg daily and no other changes made.  Today, patient reports he is doing well. Patient denies shortness of breath. No chest pain, pressure, or tightness. Denies lower extremity edema, orthopnea, or PND. No palpitations. Patient reports DOE with heavy exertion unchanged from 2021. 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 He  reports he can carry 100 lb 20-30 feet, cut down trees, blow leaves off the roof, clean gutters, and play with his kids without any difficulty. He states he gets winded and will need to rest for under a minute before continuing. The dyspnea does not prevent him from doing any activities or cause him to stop activity.  He and his wife are raising two children with autism, ages 58 and 34, and this keeps him very busy. He has no concerns with his medications. He reports he is not taking rosuvastatin and does not recall ever starting it.    Review of Systems    All other systems reviewed and are otherwise negative except as noted in History of Present Illness.  Physical Exam    VS:  BP (!) 140/82   Pulse 61   Ht '5\' 9"'$  (1.753 m)   Wt 158 lb 9.6 oz (71.9 kg)   SpO2 98%   BMI 23.42 kg/m  , BMI Body mass index is 23.42 kg/m. GEN:  Well nourished, well developed, in no acute distress. Neck: Supple, no JVD, carotid bruits, or masses. Cardiac: RRR. No murmurs. No rubs or gallops.   Respiratory:  Respirations regular and unlabored, clear to auscultation bilaterally. GI: Soft, nontender, nondistended. Extremities: Radials/DP/PT 2+ and equal bilaterally. No clubbing or cyanosis. No edema.  Skin: Warm and dry, no rash. Neuro: Strength intact.  Accessory Clinical Findings    HYPERTENSION CONTROL Vitals:   01/30/23 0924 01/30/23 1034  BP: (!) 140/82 (!) 140/80    The patient's blood pressure is elevated above target today.  In order to address  the patient's elevated BP: A current anti-hypertensive medication was adjusted today.     ECG personally reviewed by me today: NSR, rate 61 bpm.  No significant changes from 07/26/2021.  Assessment & Plan   Coronary vasospasm.  LHC March 2016 initially showed napkin ring lesion ostial LAD, however with FFR not felt to be physiologically significant.  Suspect significant coronary vasospasm.  Echo July 2021 with EF 60 to 65% and mild LVH.  Patient denies  chest pain, pressure or tightness. 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 Continue aspirin, amlodipine, isosorbide, as needed SL NTG. Start rosuvastatin (see 43).  DOE. Patient reports dyspnea with heavy exertion that is unchanged from 2021. He is able to do heavy yardwork and work part time cleaning gutters. He reports needing to stop for <1 minute to catch his breath before resuming activity. He can also pick up 100 lb and walk 20-30 feet without difficulty. Given patient's level of activity and unchanged dyspnea will not repeat echo at this time. Instructed patient to call the office if he notices dyspnea getting progressively worse or impacting his activity. He expressed understanding    Hypertension.  BP today 140/82 at intake and 140/80 at recheck. Patient does not check his pressure at home. Patient denies headaches or dizziness.  Will increase amlodipine to 5 mg daily. He will start checking his BP at home and call if consistently >130/80. Hyperlipidemia. LDL July 2021 112. Patient has not had labs since that time. He reports he has not been taking rosuvastatin and does not recall ever filling it. He will start rosuvastatin 20 mg daily. Return in 8 weeks for lipid panel, CMP, CBC, TSH.      Disposition: Increase amlodipine to 5 mg daily. Call office if BP consistently >130/80. Start rosuvastatin 20 mg daily. Return in 8 weeks for lipid panel, CMP, CBC, and TSH. Return in 1 year or sooner as needed.    [name]. [name], DNP, NP-C     01/30/2023, 9:38 AM [place] [address] Office [phone] Fax [phone]

## 2023-01-30 ENCOUNTER — Other Ambulatory Visit: Payer: Self-pay

## 2023-01-30 ENCOUNTER — Ambulatory Visit: Payer: Medicaid Other | Attending: Student | Admitting: Student

## 2023-01-30 ENCOUNTER — Encounter: Payer: Self-pay | Admitting: Student

## 2023-01-30 VITALS — BP 140/80 | HR 61 | Ht 69.0 in | Wt 158.6 lb

## 2023-01-30 DIAGNOSIS — Z79899 Other long term (current) drug therapy: Secondary | ICD-10-CM | POA: Diagnosis not present

## 2023-01-30 DIAGNOSIS — R0609 Other forms of dyspnea: Secondary | ICD-10-CM

## 2023-01-30 DIAGNOSIS — I10 Essential (primary) hypertension: Secondary | ICD-10-CM

## 2023-01-30 DIAGNOSIS — E785 Hyperlipidemia, unspecified: Secondary | ICD-10-CM

## 2023-01-30 DIAGNOSIS — I201 Angina pectoris with documented spasm: Secondary | ICD-10-CM

## 2023-01-30 MED ORDER — ROSUVASTATIN CALCIUM 20 MG PO TABS: 20.0000 mg | ORAL_TABLET | Freq: Every day | ORAL | 3 refills | Status: DC

## 2023-01-30 MED ORDER — ISOSORBIDE MONONITRATE ER 30 MG PO TB24: 30.0000 mg | ORAL_TABLET | Freq: Every day | ORAL | 1 refills | Status: DC

## 2023-01-30 MED ORDER — AMLODIPINE BESYLATE 5 MG PO TABS: 5.0000 mg | ORAL_TABLET | Freq: Every day | ORAL | 3 refills | Status: DC

## 2023-01-30 NOTE — Patient Instructions (Signed)
Medication Instructions:  Your physician has recommended you make the following change in your medication:  INCREASE: Amlodipine '5mg'$  daily  RESTART: Rosuvastatin (Crestor) '20mg'$  daily  *If you need a refill on your cardiac medications before your next appointment, please call your pharmacy*   Lab Work: Your physician recommends that you return in 8 weeks to have the following labs drawn: Lipid panel, CBC, CMP and TSH  If you have labs (blood work) drawn today and your tests are completely normal, you will receive your results only by: [place] (if you have MyChart) OR A paper copy in the mail If you have any lab test that is abnormal or we need to change your treatment, we will call you to review the results.   Testing/Procedures: NONE   Follow-Up: At [facility], you and your health needs are our priority.  As part of our continuing mission to provide you with exceptional heart care, we have created designated Provider Care Teams.  These Care Teams include your primary Cardiologist (physician) and Advanced Practice Providers (APPs -  Physician Assistants and Nurse Practitioners) who all work together to provide you with the care you need, when you need it.  We recommend signing up for the patient portal called "MyChart".  Sign up information is provided on this After Visit Summary.  MyChart is used to connect with patients for Virtual Visits (Telemedicine).  Patients are able to view lab/test results, encounter notes, upcoming appointments, etc.  Non-urgent messages can be sent to your provider as well.   To learn more about what you can do with MyChart, go to [email or web address].    Your next appointment:   1 year(s)  Provider:   Shelva Majestic, MD

## 2023-04-07 ENCOUNTER — Other Ambulatory Visit: Payer: Self-pay

## 2023-04-07 DIAGNOSIS — E785 Hyperlipidemia, unspecified: Secondary | ICD-10-CM

## 2023-04-07 DIAGNOSIS — Z79899 Other long term (current) drug therapy: Secondary | ICD-10-CM

## 2023-04-07 LAB — LIPID PANEL
HDL: 44 mg/dL (ref >39)
LDL Chol Calc (NIH): 85 mg/dL (ref 0–99)
VLDL Cholesterol Cal: 14 mg/dL (ref 5–40)

## 2023-04-08 LAB — LIPID PANEL
Chol/HDL Ratio: 3.3 ratio (ref 0.0–5.0)
Cholesterol, Total: 143 mg/dL (ref 100–199)
Triglycerides: 72 mg/dL (ref 0–149)

## 2023-04-08 LAB — COMPREHENSIVE METABOLIC PANEL
ALT: 18 IU/L (ref 0–44)
AST: 18 IU/L (ref 0–40)
Albumin/Globulin Ratio: 1.6 (ref 1.2–2.2)
Albumin: 4.4 g/dL (ref 3.8–4.9)
Alkaline Phosphatase: 101 IU/L (ref 44–121)
BUN/Creatinine Ratio: 20 (ref 10–24)
BUN: 19 mg/dL (ref 8–27)
Bilirubin Total: <0.2 mg/dL (ref 0.0–1.2)
CO2: 23 mmol/L (ref 20–29)
Calcium: 9.5 mg/dL (ref 8.6–10.2)
Chloride: 103 mmol/L (ref 96–106)
Creatinine, Ser: 0.93 mg/dL (ref 0.76–1.27)
Globulin, Total: 2.8 g/dL (ref 1.5–4.5)
Glucose: 112 mg/dL — ABNORMAL HIGH (ref 70–99)
Potassium: 5.2 mmol/L (ref 3.5–5.2)
Sodium: 139 mmol/L (ref 134–144)
Total Protein: 7.2 g/dL (ref 6.0–8.5)
eGFR: 94 mL/min/{1.73_m2} (ref >59)

## 2023-04-08 LAB — CBC
Hematocrit: 37.5 % (ref 37.5–51.0)
Hemoglobin: 12.5 g/dL — ABNORMAL LOW (ref 13.0–17.7)
MCH: 32.1 pg (ref 26.6–33.0)
MCHC: 33.3 g/dL (ref 31.5–35.7)
MCV: 96 fL (ref 79–97)
Platelets: 464 10*3/uL — ABNORMAL HIGH (ref 150–450)
RBC: 3.9 x10E6/uL — ABNORMAL LOW (ref 4.14–5.80)
RDW: 13.2 % (ref 11.6–15.4)
WBC: 7.6 10*3/uL (ref 3.4–10.8)

## 2023-04-08 LAB — TSH: TSH: 1.78 u[IU]/mL (ref 0.450–4.500)

## 2023-04-10 ENCOUNTER — Other Ambulatory Visit: Payer: Self-pay

## 2023-04-10 DIAGNOSIS — R0789 Other chest pain: Secondary | ICD-10-CM

## 2023-04-10 MED ORDER — NITROGLYCERIN 0.4 MG SL SUBL: SUBLINGUAL_TABLET | SUBLINGUAL | 1 refills | Status: DC

## 2023-04-10 MED ORDER — ISOSORBIDE MONONITRATE ER 30 MG PO TB24: 30.0000 mg | ORAL_TABLET | Freq: Every day | ORAL | 3 refills | Status: DC

## 2024-02-05 ENCOUNTER — Ambulatory Visit: Payer: Medicaid Other | Attending: Cardiovascular Disease | Admitting: Cardiovascular Disease

## 2024-02-05 ENCOUNTER — Encounter: Payer: Self-pay | Admitting: Cardiovascular Disease

## 2024-02-05 DIAGNOSIS — I10 Essential (primary) hypertension: Secondary | ICD-10-CM

## 2024-02-05 DIAGNOSIS — I201 Angina pectoris with documented spasm: Secondary | ICD-10-CM

## 2024-02-05 DIAGNOSIS — E785 Hyperlipidemia, unspecified: Secondary | ICD-10-CM

## 2024-02-05 DIAGNOSIS — I2510 Atherosclerotic heart disease of native coronary artery without angina pectoris: Secondary | ICD-10-CM | POA: Diagnosis not present

## 2024-02-05 DIAGNOSIS — Z87891 Personal history of nicotine dependence: Secondary | ICD-10-CM | POA: Diagnosis not present

## 2024-02-05 LAB — CBC

## 2024-02-05 MED ORDER — AMLODIPINE BESYLATE 10 MG PO TABS: 10.0000 mg | ORAL_TABLET | Freq: Every day | ORAL | 3 refills | Status: DC

## 2024-02-05 NOTE — Patient Instructions (Addendum)
 Medication Instructions:  Increase the Amlodipine from 5mg  to 10mg . *If you need a refill on your cardiac medications before your next appointment, please call your pharmacy*   Lab Work: Fasting labs will be drawn today. If you have labs (blood work) drawn today and your tests are completely normal, you will receive your results only by: MyChart Message (if you have MyChart) OR A paper copy in the mail If you have any lab test that is abnormal or we need to change your treatment, we will call you to review the results.   Testing/Procedures: No procedures were ordered during today's visit.    Follow-Up: At [facility], you and your health needs are our priority.  As part of our continuing mission to provide you with exceptional heart care, we have created designated Provider Care Teams.  These Care Teams include your primary Cardiologist (physician) and Advanced Practice Providers (APPs -  Physician Assistants and Nurse Practitioners) who all work together to provide you with the care you need, when you need it.  We recommend signing up for the patient portal called "MyChart".  Sign up information is provided on this After Visit Summary.  MyChart is used to connect with patients for Virtual Visits (Telemedicine).  Patients are able to view lab/test results, encounter notes, upcoming appointments, etc.  Non-urgent messages can be sent to your provider as well.   To learn more about what you can do with MyChart, go to [email or web address].    Your next appointment:   3 month(s)  Provider:   Any available APP        Other Instructions        1st Floor: - Lobby - Registration  - Pharmacy  - Lab - Cafe   2nd Floor: - PV Lab - Diagnostic Testing (echo, CT, nuclear med)   3rd Floor: - Vacant   4th Floor: - TCTS (cardiothoracic surgery) - AFib Clinic - Structural Heart Clinic - Vascular Surgery  - Vascular Ultrasound   5th Floor: - [facility]  (general and EP) - Clinical Pharmacy for coumadin, hypertension, lipid, weight-loss medications, and med management appointments      Valet parking services will be available as well.       Thank you for choosing  [facility]!
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
## 2024-02-07 LAB — COMPREHENSIVE METABOLIC PANEL
ALT: 31 IU/L (ref 0–44)
AST: 25 IU/L (ref 0–40)
Albumin: 4.8 g/dL (ref 3.9–4.9)
Alkaline Phosphatase: 100 IU/L (ref 44–121)
BUN/Creatinine Ratio: 16 (ref 10–24)
BUN: 15 mg/dL (ref 8–27)
Bilirubin Total: 0.2 mg/dL (ref 0.0–1.2)
CO2: 21 mmol/L (ref 20–29)
Calcium: 9.7 mg/dL (ref 8.6–10.2)
Chloride: 103 mmol/L (ref 96–106)
Creatinine, Ser: 0.96 mg/dL (ref 0.76–1.27)
Globulin, Total: 2.4 g/dL (ref 1.5–4.5)
Glucose: 101 mg/dL — ABNORMAL HIGH (ref 70–99)
Potassium: 4.7 mmol/L (ref 3.5–5.2)
Sodium: 138 mmol/L (ref 134–144)
Total Protein: 7.2 g/dL (ref 6.0–8.5)
eGFR: 90 mL/min/{1.73_m2} (ref >59)

## 2024-02-07 LAB — LIPID PANEL
Chol/HDL Ratio: 4 ratio (ref 0.0–5.0)
Cholesterol, Total: 169 mg/dL (ref 100–199)
HDL: 42 mg/dL (ref >39)
LDL Chol Calc (NIH): 81 mg/dL (ref 0–99)
Triglycerides: 280 mg/dL — ABNORMAL HIGH (ref 0–149)
VLDL Cholesterol Cal: 46 mg/dL — ABNORMAL HIGH (ref 5–40)

## 2024-02-07 LAB — CBC
Hematocrit: 40 % (ref 37.5–51.0)
Hemoglobin: 13.4 g/dL (ref 13.0–17.7)
MCH: 32.2 pg (ref 26.6–33.0)
MCHC: 33.5 g/dL (ref 31.5–35.7)
MCV: 96 fL (ref 79–97)
Platelets: 501 10*3/uL — ABNORMAL HIGH (ref 150–450)
RBC: 4.16 x10E6/uL (ref 4.14–5.80)
RDW: 13.1 % (ref 11.6–15.4)
WBC: 8.1 10*3/uL (ref 3.4–10.8)

## 2024-02-07 LAB — TSH: TSH: 2.26 u[IU]/mL (ref 0.450–4.500)

## 2024-02-07 LAB — LIPOPROTEIN A (LPA): Lipoprotein (a): 231.8 nmol/L — ABNORMAL HIGH (ref <75.0)

## 2024-02-14 ENCOUNTER — Other Ambulatory Visit: Payer: Self-pay

## 2024-02-15 NOTE — Progress Notes (Signed)
 Spoke with the wife of patient. Sent medication to the pharmacy Rosuvastatin 40mg . Wife will relay to husband the results.

## 2024-02-29 ENCOUNTER — Encounter (HOSPITAL_COMMUNITY): Payer: Self-pay

## 2024-02-29 ENCOUNTER — Telehealth: Payer: Self-pay | Admitting: Cardiovascular Disease

## 2024-02-29 ENCOUNTER — Other Ambulatory Visit: Payer: Self-pay

## 2024-02-29 ENCOUNTER — Emergency Department (HOSPITAL_COMMUNITY)
Admission: EM | Admit: 2024-02-29 | Discharge: 2024-02-29 | Disposition: A | Discharge status: Home / Self Care | Attending: Emergency Medicine | Admitting: Emergency Medicine

## 2024-02-29 ENCOUNTER — Emergency Department (HOSPITAL_COMMUNITY)

## 2024-02-29 DIAGNOSIS — R197 Diarrhea, unspecified: Secondary | ICD-10-CM | POA: Diagnosis not present

## 2024-02-29 DIAGNOSIS — R55 Syncope and collapse: Secondary | ICD-10-CM | POA: Diagnosis present

## 2024-02-29 DIAGNOSIS — R5383 Other fatigue: Secondary | ICD-10-CM | POA: Diagnosis not present

## 2024-02-29 DIAGNOSIS — R6883 Chills (without fever): Secondary | ICD-10-CM | POA: Insufficient documentation

## 2024-02-29 DIAGNOSIS — R531 Weakness: Secondary | ICD-10-CM | POA: Insufficient documentation

## 2024-02-29 DIAGNOSIS — Z79899 Other long term (current) drug therapy: Secondary | ICD-10-CM | POA: Insufficient documentation

## 2024-02-29 DIAGNOSIS — J449 Chronic obstructive pulmonary disease, unspecified: Secondary | ICD-10-CM | POA: Insufficient documentation

## 2024-02-29 DIAGNOSIS — I10 Essential (primary) hypertension: Secondary | ICD-10-CM | POA: Diagnosis not present

## 2024-02-29 DIAGNOSIS — Z7982 Long term (current) use of aspirin: Secondary | ICD-10-CM | POA: Diagnosis not present

## 2024-02-29 DIAGNOSIS — R0602 Shortness of breath: Secondary | ICD-10-CM | POA: Insufficient documentation

## 2024-02-29 LAB — URINALYSIS, ROUTINE W REFLEX MICROSCOPIC
Bilirubin Urine: NEGATIVE
Glucose, UA: NEGATIVE mg/dL
Ketones, ur: 15 mg/dL — AB
Leukocytes,Ua: NEGATIVE
Nitrite: NEGATIVE
Protein, ur: 30 mg/dL — AB
Specific Gravity, Urine: 1.025 (ref 1.005–1.030)
pH: 6 (ref 5.0–8.0)

## 2024-02-29 LAB — BASIC METABOLIC PANEL WITH GFR
Anion gap: 12 (ref 5–15)
BUN: 24 mg/dL — ABNORMAL HIGH (ref 8–23)
CO2: 18 mmol/L — ABNORMAL LOW (ref 22–32)
Calcium: 9.8 mg/dL (ref 8.9–10.3)
Chloride: 107 mmol/L (ref 98–111)
Creatinine, Ser: 1.08 mg/dL (ref 0.61–1.24)
GFR, Estimated: >60 mL/min (ref >60)
Glucose, Bld: 105 mg/dL — ABNORMAL HIGH (ref 70–99)
Potassium: 3.7 mmol/L (ref 3.5–5.1)
Sodium: 137 mmol/L (ref 135–145)

## 2024-02-29 LAB — URINALYSIS, MICROSCOPIC (REFLEX)

## 2024-02-29 LAB — HEPATIC FUNCTION PANEL
ALT: 30 U/L (ref 0–44)
AST: 34 U/L (ref 15–41)
Albumin: 4.4 g/dL (ref 3.5–5.0)
Alkaline Phosphatase: 81 U/L (ref 38–126)
Bilirubin, Direct: 0.1 mg/dL (ref 0.0–0.2)
Indirect Bilirubin: 0.7 mg/dL (ref 0.3–0.9)
Total Bilirubin: 0.8 mg/dL (ref 0.0–1.2)
Total Protein: 7.8 g/dL (ref 6.5–8.1)

## 2024-02-29 LAB — CBC
HCT: 40 % (ref 39.0–52.0)
Hemoglobin: 14.2 g/dL (ref 13.0–17.0)
MCH: 32.6 pg (ref 26.0–34.0)
MCHC: 35.5 g/dL (ref 30.0–36.0)
MCV: 91.7 fL (ref 80.0–100.0)
Platelets: 427 10*3/uL — ABNORMAL HIGH (ref 150–400)
RBC: 4.36 MIL/uL (ref 4.22–5.81)
RDW: 12.9 % (ref 11.5–15.5)
WBC: 7.4 10*3/uL (ref 4.0–10.5)
nRBC: 0 % (ref 0.0–0.2)

## 2024-02-29 LAB — RESP PANEL BY RT-PCR (RSV, FLU A&B, COVID)  RVPGX2
Influenza A by PCR: NEGATIVE
Influenza B by PCR: NEGATIVE
Resp Syncytial Virus by PCR: NEGATIVE
SARS Coronavirus 2 by RT PCR: NEGATIVE

## 2024-02-29 LAB — COOXEMETRY PANEL
Carboxyhemoglobin: 1.7 % — ABNORMAL HIGH (ref 0.5–1.5)
Methemoglobin: 0.7 % (ref 0.0–1.5)
O2 Saturation: 93.3 %
Total hemoglobin: 14.1 g/dL (ref 12.0–16.0)

## 2024-02-29 LAB — TROPONIN I (HIGH SENSITIVITY): Troponin I (High Sensitivity): 3 ng/L (ref <18)

## 2024-02-29 LAB — CBG MONITORING, ED: Glucose-Capillary: 108 mg/dL — ABNORMAL HIGH (ref 70–99)

## 2024-02-29 LAB — BRAIN NATRIURETIC PEPTIDE: B Natriuretic Peptide: 6.5 pg/mL (ref 0.0–100.0)
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
## 2024-02-29 NOTE — Telephone Encounter (Signed)
 Patient identification verified by 2 forms. [name], RN    Received call from patient wife Curtis Simpson states:   -patient has not felt good for a couple of nights   -Two night ago patient developed trouble breathing   -Yesterday he was off and agitated all day   -yesterday had complaints of headache and nausea   -he developed could sweats as well   -patient has a history of valve disorder had similar symptoms previously   -last night patient woke up with cold sweat and almost passed out   -this morning he has to take deep breaths   -patient reports right lung feels full   -patient recently treated with abx for lung infection  -patient feels unwell and overall drained   -contacted PCP office who recommended ED evaluation  Curtis Simpson denies:   -chest pain   -LOC/fall   -fever/chills Advised Curtis Simpson to present to ED with patient for evaluation  Curtis Simpson agrees with plan, no questions at this time
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
## 2024-02-29 NOTE — Telephone Encounter (Signed)
.  SYNCOPECHMG   Pt c/o Syncope: STAT if syncope occurred within 24 hours and pt complains of lightheadedness.   High Priority if episode of passing out, completely, today or in last 24 hours   1. Did you pass out today? No- just about passed out last night   2. When is the last time you passed out?  Last night he just about passed out   3. Has this occurred multiple times? no  4. Did you have any symptoms prior to passing out? Nauseated, headache, tired, very agitated and short of breath- cold sweats for an hour last night- body was ice cold- pulse rate was really low-  she can not remember the rate- patient keep saying his lungs are full- she thinks patient needs to be seen  5. Did you fall? If so, are you on a blood thinner? no

## 2024-04-11 ENCOUNTER — Other Ambulatory Visit: Payer: Self-pay | Admitting: Student

## 2024-04-11 MED ORDER — AMLODIPINE BESYLATE 10 MG PO TABS: 10.0000 mg | ORAL_TABLET | Freq: Every day | ORAL | 3 refills | Status: AC
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
## 2024-05-08 ENCOUNTER — Encounter: Payer: Self-pay | Admitting: Physician Assistant

## 2024-05-08 ENCOUNTER — Ambulatory Visit: Attending: Student | Admitting: Physician Assistant

## 2024-05-08 VITALS — BP 150/90 | HR 76 | Ht 69.0 in | Wt 165.2 lb

## 2024-05-08 DIAGNOSIS — I10 Essential (primary) hypertension: Secondary | ICD-10-CM | POA: Diagnosis not present

## 2024-05-08 DIAGNOSIS — I2510 Atherosclerotic heart disease of native coronary artery without angina pectoris: Secondary | ICD-10-CM | POA: Insufficient documentation

## 2024-05-08 DIAGNOSIS — Z79899 Other long term (current) drug therapy: Secondary | ICD-10-CM | POA: Insufficient documentation

## 2024-05-08 DIAGNOSIS — E785 Hyperlipidemia, unspecified: Secondary | ICD-10-CM | POA: Diagnosis present

## 2024-05-08 MED ORDER — ROSUVASTATIN CALCIUM 40 MG PO TABS
40.0000 mg | ORAL_TABLET | Freq: Every day | ORAL | 1 refills | Status: AC
Start: 2024-05-08 — End: ?

## 2024-05-08 NOTE — Patient Instructions (Signed)
 Medication Instructions:  Discontinue 20 mg of Crestor , start taking 40 mg daily  *If you need a refill on your cardiac medications before your next appointment, please call your pharmacy*  Lab Work: In 2 months, go to the lab for fasting blood work. (Nothing to eat after midnight) Fasting Lipid Panel and ALT If you have labs (blood work) drawn today and your tests are completely normal, you will receive your results only by: MyChart Message (if you have MyChart) OR A paper copy in the mail If you have any lab test that is abnormal or we need to change your treatment, we will call you to review the results.  Testing/Procedures: None  Follow-Up: At [facility], you and your health needs are our priority.  As part of our continuing mission to provide you with exceptional heart care, our providers are all part of one team.  This team includes your primary Cardiologist (physician) and Advanced Practice Providers or APPs (Physician Assistants and Nurse Practitioners) who all work together to provide you with the care you need, when you need it.  Your next appointment:   4 month(s)  Provider:   Dr. Addie Holstein

## 2024-06-20 ENCOUNTER — Other Ambulatory Visit: Payer: Self-pay | Admitting: Student

## 2024-10-02 ENCOUNTER — Encounter (HOSPITAL_BASED_OUTPATIENT_CLINIC_OR_DEPARTMENT_OTHER): Payer: Self-pay | Admitting: Emergency Medicine

## 2024-10-02 ENCOUNTER — Other Ambulatory Visit: Payer: Self-pay

## 2024-10-02 ENCOUNTER — Emergency Department (HOSPITAL_BASED_OUTPATIENT_CLINIC_OR_DEPARTMENT_OTHER)
Admission: EM | Admit: 2024-10-02 | Discharge: 2024-10-02 | Disposition: A | Discharge status: Home / Self Care | Attending: Emergency Medicine | Admitting: Emergency Medicine

## 2024-10-02 DIAGNOSIS — S01112A Laceration without foreign body of left eyelid and periocular area, initial encounter: Secondary | ICD-10-CM | POA: Diagnosis not present

## 2024-10-02 DIAGNOSIS — Z79899 Other long term (current) drug therapy: Secondary | ICD-10-CM | POA: Diagnosis not present

## 2024-10-02 DIAGNOSIS — I10 Essential (primary) hypertension: Secondary | ICD-10-CM | POA: Insufficient documentation

## 2024-10-02 DIAGNOSIS — W228XXA Striking against or struck by other objects, initial encounter: Secondary | ICD-10-CM | POA: Insufficient documentation

## 2024-10-02 DIAGNOSIS — Z7982 Long term (current) use of aspirin: Secondary | ICD-10-CM | POA: Insufficient documentation

## 2024-10-02 DIAGNOSIS — J449 Chronic obstructive pulmonary disease, unspecified: Secondary | ICD-10-CM | POA: Insufficient documentation

## 2024-10-02 DIAGNOSIS — S01119A Laceration without foreign body of unspecified eyelid and periocular area, initial encounter: Secondary | ICD-10-CM

## 2024-10-02 DIAGNOSIS — I2510 Atherosclerotic heart disease of native coronary artery without angina pectoris: Secondary | ICD-10-CM | POA: Diagnosis not present

## 2024-10-02 DIAGNOSIS — S0993XA Unspecified injury of face, initial encounter: Secondary | ICD-10-CM | POA: Diagnosis present

## 2024-10-02 MED ORDER — CEPHALEXIN 500 MG PO CAPS: 500.0000 mg | ORAL_CAPSULE | Freq: Two times a day (BID) | ORAL | 0 refills | Status: AC

## 2024-10-02 MED ORDER — LIDOCAINE-EPINEPHRINE (PF) 2 %-1:200000 IJ SOLN
10.0000 mL | Freq: Once | INTRAMUSCULAR | Status: AC
  Administered 2024-10-02: 10 mL
  Filled 2024-10-02: qty 20

## 2024-10-02 MED ORDER — ACETAMINOPHEN 500 MG PO TABS
1000.0000 mg | ORAL_TABLET | Freq: Once | ORAL | Status: AC
  Administered 2024-10-02: 1000 mg via ORAL
  Filled 2024-10-02: qty 2

## 2024-10-02 MED ORDER — TETANUS-DIPHTH-ACELL PERTUSSIS 5-2-15.5 LF-MCG/0.5 IM SUSP
0.5000 mL | Freq: Once | INTRAMUSCULAR | Status: AC
  Administered 2024-10-02: 0.5 mL via INTRAMUSCULAR
  Filled 2024-10-02: qty 0.5

## 2024-10-02 NOTE — Discharge Instructions (Addendum)
 Keep area clean dry and covered.  You can wash with gentle soap and water once daily.  Take antibiotic as prescribed.  You can use cold compress over area of pain. Sutures are dissolvable.   Return to emergency room with any new or worsening symptoms.  Including fever, worsening swelling or wound dehiscence.

## 2024-10-02 NOTE — ED Provider Notes (Signed)
 [place] EMERGENCY DEPARTMENT AT [facility] Provider Note   CSN: [id number] Arrival date & time: 10/02/24  1439     Patient presents with: Laceration   Curtis Simpson is a 64 y.o. male.  With past medical history of coronary artery disease, hypertension, hyperlipidemia, COPD presents to emergency room with complaint of laceration.  Patient reports that he was trying to break a tree branch and it snapped back hitting him into the face.  This occurred approximately 3 hours prior to arrival.  He now has laceration right below his left eyebrow approximately the centimeters.  He is not on blood thinner.  He is unsure of last tetanus shot.    Laceration      Prior to Admission medications   Medication Sig Start Date End Date Taking? Authorizing Provider  cephALEXin (KEFLEX) 500 MG capsule Take 1 capsule (500 mg total) by mouth 2 (two) times daily for 5 days. 10/02/24 10/07/24 Yes Caylen Kuwahara N, PA-C  amLODipine  (NORVASC ) 10 MG tablet Take 1 tablet (10 mg total) by mouth daily. 04/11/24   Burnard Debby LABOR, MD  aspirin  EC 81 MG EC tablet Take 1 tablet (81 mg total) by mouth daily. 02/04/15   Kilroy, Luke K, PA-C  glucose 4 GM chewable tablet Chew 1 tablet by mouth once. Patient not taking: Reported on 05/08/2024    [provider]  isosorbide  mononitrate (IMDUR ) 30 MG 24 hr tablet TAKE 1 TABLET BY MOUTH EVERY DAY 06/20/24   Loistine Sober, NP  Multiple Vitamins-Minerals (CENTRUM SILVER PO) Take 1 tablet by mouth daily.    [provider]  nitroGLYCERIN  (NITROSTAT ) 0.4 MG SL tablet DISSOLVE ONE TABLET UNDER THE TONGUE EVERY 5 MINUTES AS NEEDED FOR CHEST PAIN FOR 3 DOSES Patient not taking: Reported on 05/08/2024 04/10/23   Loistine Sober, NP  rosuvastatin  (CRESTOR ) 40 MG tablet Take 1 tablet (40 mg total) by mouth daily. 05/08/24   Sheron Lorette GRADE, PA-C  VENTOLIN HFA 108 (90 Base) MCG/ACT inhaler Inhale 2 puffs into the lungs every 6 (six) hours as needed.  04/16/24   [provider]    Allergies: Morphine and codeine    Review of Systems  Skin:  Positive for wound.    Updated Vital Signs BP 136/89 (BP Location: Right Arm)   Pulse 95   Temp 98.4 F (36.9 C) (Oral)   Resp 14   Ht 5' 9 (1.753 m)   Wt 74.4 kg   SpO2 99%   BMI 24.22 kg/m   Physical Exam Vitals and nursing note reviewed.  Constitutional:      General: He is not in acute distress.    Appearance: He is not toxic-appearing.  HENT:     Head: Normocephalic and atraumatic.     Mouth/Throat:     Comments: Pupils are equal and reactive, no nystagmus.  Normal eye range of motion. Eyes:     General: No scleral icterus.    Conjunctiva/sclera: Conjunctivae normal.  Cardiovascular:     Rate and Rhythm: Normal rate and regular rhythm.     Pulses: Normal pulses.     Heart sounds: Normal heart sounds.  Pulmonary:     Effort: Pulmonary effort is normal. No respiratory distress.     Breath sounds: Normal breath sounds.  Abdominal:     General: Abdomen is flat. Bowel sounds are normal.     Palpations: Abdomen is soft.     Tenderness: There is no abdominal tenderness.  Musculoskeletal:     Right  lower leg: No edema.     Left lower leg: No edema.  Skin:    General: Skin is warm and dry.     Findings: No lesion.     Comments: Approximately 3 cm laceration below left eyebrow.  Neurological:     General: No focal deficit present.     Mental Status: He is alert and oriented to person, place, and time. Mental status is at baseline.     (all labs ordered are listed, but only abnormal results are displayed) Labs Reviewed - No data to display  EKG: None  Radiology: No results found.   .Laceration Repair  Date/Time: 10/02/2024 4:32 PM  Performed by: Shermon Warren SAILOR, PA-C Authorized by: Shermon Warren SAILOR, PA-C   Consent:    Consent obtained:  Verbal   Consent given by:  Patient   Risks, benefits, and alternatives were discussed: yes     Risks discussed:   Infection, need for additional repair, nerve damage, poor wound healing, poor cosmetic result, pain, retained foreign body, tendon damage and vascular damage   Alternatives discussed:  No treatment, delayed treatment, observation and referral Universal protocol:    Procedure explained and questions answered to patient or proxy's satisfaction: yes     Relevant documents present and verified: yes     Test results available: yes     Imaging studies available: yes     Required blood products, implants, devices, and special equipment available: yes     Site/side marked: yes     Immediately prior to procedure, a time out was called: yes     Patient identity confirmed:  Verbally with patient Anesthesia:    Anesthesia method:  Local infiltration   Local anesthetic:  Lidocaine  2% WITH epi Laceration details:    Location:  Face   Face location:  L upper eyelid   Extent:  Partial thickness   Length (cm):  3 Pre-procedure details:    Preparation:  Patient was prepped and draped in usual sterile fashion Treatment:    Area cleansed with:  Soap and water   Amount of cleaning:  Standard   Irrigation solution:  Sterile saline Skin repair:    Repair method:  Sutures   Suture size:  5-0   Suture material:  Chromic gut   Suture technique:  Simple interrupted   Number of sutures:  6 Approximation:    Approximation:  Close Repair type:    Repair type:  Intermediate Post-procedure details:    Dressing:  Open (no dressing)   Procedure completion:  Tolerated    Medications Ordered in the ED  lidocaine -EPINEPHrine (XYLOCAINE  W/EPI) 2 %-1:200000 (PF) injection 10 mL (10 mLs Infiltration Given 10/02/24 1519)  Tdap (ADACEL) injection 0.5 mL (0.5 mLs Intramuscular Given 10/02/24 1518)  acetaminophen  (TYLENOL ) tablet 1,000 mg (1,000 mg Oral Given 10/02/24 1518)                                    Medical Decision Making Risk OTC drugs. Prescription drug management.   This patient presents to the  ED for concern of lac, this involves an extensive number of treatment options, and is a complaint that carries with it a high risk of complications and morbidity.  The differential diagnosis includes intracranial bleed, lac, abscess, orbital fracture, globe rupture    Imaging Studies ordered:  Considered however patient is [place] head CT negative   Cardiac Monitoring: / EKG:  The patient was maintained on a cardiac monitor.     Problem List / ED Course / Critical interventions / Medication management  Sustained laceration from tree branch hitting left upper eye.  This is over left upper eyelid and it is not a full-thickness laceration, nor through lid margin.  Eye appears atraumatic. Reassuring physical exam. Tdap was updated here, neurovascularly intact. Laceration repaired without difficulty.  I ordered medication including Tylenol  for pain control, updated tetanus shot Patient [place] head CT negative due to age, not on blood thinner, no seizure, GCS 15, suspected skull fracture, no repetitive episodes of vomiting, nondangerous mechanism and no amnesia related to event. Reevaluation of the patient after these medicines showed that the patient improved I have reviewed the patients home medicines and have made adjustments as needed. Will give follow up for recheck, they are interested in prophylactic antibiotics, will do 5 days Keflex. Given return precautions.         Final diagnoses:  Laceration of eyelid without involvement of lid margin    ED Discharge Orders          Ordered    cephALEXin (KEFLEX) 500 MG capsule  2 times daily        10/02/24 1614               Shylynn Bruning, Warren SAILOR, PA-C 10/02/24 1642    Ula Prentice SAUNDERS, MD 10/02/24 2328

## 2024-10-02 NOTE — ED Triage Notes (Signed)
 Pt caox4 ambulatory c/o lac above L eye from tree branch stating he was breaking one in half approx 2-3 hrs ago and it hit him in the face. Denies LOC. Denies any other injury or complaints. Unknown last Tdap.

## 2024-10-03 ENCOUNTER — Ambulatory Visit: Payer: Self-pay | Admitting: *Deleted

## 2024-10-03 NOTE — Telephone Encounter (Signed)
 FYI Only or Action Required?: FYI only for provider: f/u with PCP or go back to ED after trying treatment .  Patient was last seen in primary care on na .  Called Nurse Triage reporting Eye Problem.  Symptoms began yesterday.  Interventions attempted: Nothing.  Symptoms are: rapidly worsening.  Triage Disposition: See HCP Within 4 Hours (Or PCP Triage)  Patient/caregiver understands and will follow disposition?: No, wishes to speak with PCP        Copied from CRM #[id number]. Topic: Clinical - Red Word Triage >> Oct 03, 2024  8:14 AM Miquel SAILOR wrote: Red Word that prompted transfer to Nurse Triage: Patient LT eye swollen due to branch going in it went to ER on 11/05 still can not walk Reason for Disposition  [1] SEVERE eyelid swelling (i.e., shut or almost) AND [2] involves both eyes (Exception: Itchy eyes, which  are probably an allergic reaction.)  Answer Assessment - Initial Assessment Questions Patient wife on DPR calling to requesting what else patient should do for pain in left eye and now swollen shut. Patient went to ED and recommendations given. Wife reports no treatments have been tried thus far. Recommended to start antibiotics ordered , ibuprofen  as directed from ED provider and cool compress to left eye as ordered. If sx not improved go back to ED due to eye swollen shut now and right eye swelling. Reports not hardly able to walk due to eye swollen shut. Denies dizziness. Recommended to f/u with PCP at [facility] and wife reports they told me to call yall back. Wife reports she will start recommendations and medications for patient that were recommended by ED and if not better will take pt back to ED.        1. ONSET: When did the swelling start? (e.g., minutes, hours, days)     Yesterday  2. LOCATION: What part of the eyelids is swollen?     Left eye swollen shut this am and right eye starting to swell 3. SEVERITY: How swollen is it?     Left eye swollen shut  4.  ITCHING: Is there any itching? If Yes, ask: How much?   (Scale 1-10; mild, moderate or severe)     na 5. PAIN: Is the swelling painful to touch? If Yes, ask: How painful is it?   (Scale 1-10; mild, moderate or severe)     8/10  6. FEVER: Do you have a fever? If Yes, ask: What is it, how was it measured, and when did it start?      na 7. CAUSE: What do you think is causing the swelling?     Hit in the eye with tree branch  8. RECURRENT SYMPTOM: Have you had eyelid swelling before? If Yes, ask: When was the last time? What happened that time?     Yesterday went to ED.  9. OTHER SYMPTOMS: Do you have any other symptoms? (e.g., blurred vision, eye discharge, rash, runny nose)     Headache left eye pain and swelling shut. Right eye swelling now too but not shut. Reports can hardly walk due to difficulty seeing. 10. PREGNANCY: Is there any chance you are pregnant? When was your last menstrual period?       na  Protocols used: Eye - Swelling-A-AH

## 2024-12-05 ENCOUNTER — Other Ambulatory Visit: Payer: Self-pay | Admitting: Student

## 2024-12-05 DIAGNOSIS — R0789 Other chest pain: Secondary | ICD-10-CM
